# Patient Record
Sex: Female | Born: 1935 | Race: White | Hispanic: No | Smoking: Former smoker
Health system: Southern US, Community
[De-identification: ages and names within clinical notes are randomized; demographics above are authoritative.]

## PROBLEM LIST (undated history)

## (undated) DIAGNOSIS — I442 Atrioventricular block, complete: Secondary | ICD-10-CM

## (undated) DIAGNOSIS — Z972 Presence of dental prosthetic device (complete) (partial): Secondary | ICD-10-CM

## (undated) DIAGNOSIS — Z974 Presence of external hearing-aid: Secondary | ICD-10-CM

## (undated) DIAGNOSIS — M1990 Unspecified osteoarthritis, unspecified site: Secondary | ICD-10-CM

## (undated) DIAGNOSIS — I8290 Acute embolism and thrombosis of unspecified vein: Secondary | ICD-10-CM

## (undated) HISTORY — PX: COLONOSCOPY: SHX174

## (undated) HISTORY — PX: CHOLECYSTECTOMY: SHX55

---

## 2005-07-24 ENCOUNTER — Inpatient Hospital Stay: Payer: Self-pay | Admitting: Infectious Diseases

## 2005-07-24 ENCOUNTER — Other Ambulatory Visit: Payer: Self-pay

## 2005-08-03 ENCOUNTER — Emergency Department: Payer: Self-pay | Admitting: Internal Medicine

## 2005-08-11 ENCOUNTER — Ambulatory Visit: Payer: Self-pay | Admitting: Internal Medicine

## 2005-09-18 ENCOUNTER — Ambulatory Visit: Payer: Self-pay

## 2006-01-19 ENCOUNTER — Ambulatory Visit: Payer: Self-pay | Admitting: Unknown Physician Specialty

## 2006-08-17 ENCOUNTER — Other Ambulatory Visit: Payer: Self-pay

## 2006-08-17 ENCOUNTER — Inpatient Hospital Stay: Payer: Self-pay | Admitting: Internal Medicine

## 2010-03-03 ENCOUNTER — Ambulatory Visit: Payer: Self-pay | Admitting: Internal Medicine

## 2015-10-25 ENCOUNTER — Encounter: Payer: Self-pay | Admitting: *Deleted

## 2015-10-29 NOTE — Discharge Instructions (Signed)
INSTRUCTIONS FOLLOWING OCULOPLASTIC SURGERY °AMY M. FOWLER, MD ° °AFTER YOUR EYE SURGERY, THER ARE MANY THINGS THWIHC YOU, THE PATIENT, CAN DO TO ASSURE THE BEST POSSIBLE RESULT FROM YOUR OPERATION.  THIS SHEET SHOULD BE REFERRED TO WHENEVER QUESTIONS ARISE.  IF THERE ARE ANY QUESTIONS NOT ANSWERED HERE, DO NOT HESITATE TO CALL OUR OFFICE AT [phone] OR [phone].  THERE IS ALWAYS [facility] AVAILABLE TO CALL IF QUESTIONS OR PROBLEMS ARISE. ° °VISION: Your vision may be blurred and out of focus after surgery until you are able to stop using your ointment, swelling resolves and your eye(s) heal. This may take 1 to 2 weeks at the least.  If your vision becomes gradually more dim or dark, this is not normal and you need to call our office immediately. ° °EYE CARE: For the first 48 hours after surgery, use ice packs frequently - “20 minutes on, 20 minutes off” - to help reduce swelling and bruising.  Small bags of frozen peas or corn make good ice packs along with cloths soaked in ice water.  If you are wearing a patch or other type of dressing following surgery, keep this on for the amount of time specified by your doctor.  For the first week following surgery, you will need to treat your stitches with great care.  If is OK to shower, but take care to not allow soapy water to run into your eye(s) to help reduce changes of infection.  You may gently clean the eyelashes and around the eye(s) with cotton balls and sterile water, BUT DO NOT RUB THE STITCHES VIGOROUSLY.  Keeping your stitches moist with ointment will help promote healing with minimal scar formation. ° °ACTIVITY: When you leave the surgery center, you should go home, rest and be inactive.  The eye(s) may feel scratchy and keeping the eyes closed will allow for faster healing.  The first week following surgery, avoid straining (anything making the face turn red) or lifting over 20 pounds.  Additionally, avoid bending which causes your head to go below  your waist.  Using your eyes will NOT harm them, so feel free to read, watch television, use the computer, etc as desired.  Driving depends on each individual, so check with your doctor if you have questions about driving. ° °MEDICATIONS:  You will be given a prescription for an ointment to use 4 times a day on your stitches.  You can use the ointment in your eyes if they feel scratchy or irritated.  If you eyelid(s) don’t close completely when you sleep, put some ointment in your eyes before bedtime. ° °EMERGENCY: If you experience SEVERE EYE PAIN OR HEADACHE UNRELIEVED BY TYLENOL OR PERCOCET, NAUSEA OR VOMITING, WORSENING REDNESS, OR WORSENING VISION (ESPECIALLY VISION THAT WA INITIALLY BETTER) CALL [phone] OR [phone] DURING BUSINESS HOURS OR AFTER HOURS. ° °General Anesthesia, Adult, Care After °Refer to this sheet in the next few weeks. These instructions provide you with information on caring for yourself after your procedure. Your health care provider may also give you more specific instructions. Your treatment has been planned according to current medical practices, but problems sometimes occur. Call your health care provider if you have any problems or questions after your procedure. °WHAT TO EXPECT AFTER THE PROCEDURE °After the procedure, it is typical to experience: °· Sleepiness. °· Nausea and vomiting. °HOME CARE INSTRUCTIONS °· For the first 24 hours after general anesthesia: °¨ Have a responsible person with you. °¨ Do not drive a car. If you   are alone, do not take public transportation. °¨ Do not drink alcohol. °¨ Do not take medicine that has not been prescribed by your health care provider. °¨ Do not sign important papers or make important decisions. °¨ You may resume a normal diet and activities as directed by your health care provider. °· Change bandages (dressings) as directed. °· If you have questions or problems that seem related to general anesthesia, call the hospital and ask for  the anesthetist or anesthesiologist on call. °SEEK MEDICAL CARE IF: °· You have nausea and vomiting that continue the day after anesthesia. °· You develop a rash. °SEEK IMMEDIATE MEDICAL CARE IF:  °· You have difficulty breathing. °· You have chest pain. °· You have any allergic problems. °  °This information is not intended to replace advice given to you by your health care provider. Make sure you discuss any questions you have with your health care provider. °  °Document Released: 03/09/2001 Document Revised: 12/22/2014 Document Reviewed: 03/31/2012 °Elsevier Interactive Patient Education ©2016 [employer] ° °

## 2015-10-30 ENCOUNTER — Encounter: Payer: Self-pay | Admitting: *Deleted

## 2015-10-30 ENCOUNTER — Ambulatory Visit: Payer: Medicare Other | Admitting: Student in an Organized Health Care Education/Training Program

## 2015-10-30 ENCOUNTER — Encounter
Admission: RE | Disposition: A | Discharge status: Home / Self Care | Payer: Self-pay | Source: Ambulatory Visit | Attending: Ophthalmology

## 2015-10-30 ENCOUNTER — Ambulatory Visit
Admission: RE | Admit: 2015-10-30 | Discharge: 2015-10-30 | Disposition: A | Discharge status: Home / Self Care | Payer: Medicare Other | Source: Ambulatory Visit | Attending: Ophthalmology | Admitting: Ophthalmology

## 2015-10-30 DIAGNOSIS — H02403 Unspecified ptosis of bilateral eyelids: Secondary | ICD-10-CM | POA: Diagnosis not present

## 2015-10-30 DIAGNOSIS — Z79899 Other long term (current) drug therapy: Secondary | ICD-10-CM | POA: Insufficient documentation

## 2015-10-30 DIAGNOSIS — H02035 Senile entropion of left lower eyelid: Secondary | ICD-10-CM | POA: Diagnosis not present

## 2015-10-30 DIAGNOSIS — Z87891 Personal history of nicotine dependence: Secondary | ICD-10-CM | POA: Insufficient documentation

## 2015-10-30 DIAGNOSIS — Z86718 Personal history of other venous thrombosis and embolism: Secondary | ICD-10-CM | POA: Insufficient documentation

## 2015-10-30 DIAGNOSIS — Z9049 Acquired absence of other specified parts of digestive tract: Secondary | ICD-10-CM | POA: Insufficient documentation

## 2015-10-30 DIAGNOSIS — H02834 Dermatochalasis of left upper eyelid: Secondary | ICD-10-CM | POA: Insufficient documentation

## 2015-10-30 DIAGNOSIS — H9193 Unspecified hearing loss, bilateral: Secondary | ICD-10-CM | POA: Insufficient documentation

## 2015-10-30 DIAGNOSIS — M1990 Unspecified osteoarthritis, unspecified site: Secondary | ICD-10-CM | POA: Diagnosis not present

## 2015-10-30 DIAGNOSIS — H02831 Dermatochalasis of right upper eyelid: Secondary | ICD-10-CM | POA: Insufficient documentation

## 2015-10-30 DIAGNOSIS — H02045 Spastic entropion of left lower eyelid: Secondary | ICD-10-CM | POA: Diagnosis not present

## 2015-10-30 DIAGNOSIS — H02836 Dermatochalasis of left eye, unspecified eyelid: Secondary | ICD-10-CM | POA: Diagnosis present

## 2015-10-30 HISTORY — DX: Presence of dental prosthetic device (complete) (partial): Z97.2

## 2015-10-30 HISTORY — PX: PTOSIS REPAIR: SHX6568

## 2015-10-30 HISTORY — PX: ENTROPIAN REPAIR: SHX1512

## 2015-10-30 HISTORY — DX: Unspecified osteoarthritis, unspecified site: M19.90

## 2015-10-30 HISTORY — DX: Presence of external hearing-aid: Z97.4

## 2015-10-30 HISTORY — PX: BROW LIFT: SHX178

## 2015-10-30 HISTORY — DX: Acute embolism and thrombosis of unspecified vein: I82.90

## 2015-10-30 SURGERY — BLEPHAROPLASTY
Anesthesia: Monitor Anesthesia Care | Site: Eye | Laterality: Bilateral | Wound class: Clean

## 2015-10-30 MED ORDER — LIDOCAINE-EPINEPHRINE 2 %-1:100000 IJ SOLN
INTRAMUSCULAR | Status: DC | PRN
  Administered 2015-10-30: 10 mL via OPHTHALMIC

## 2015-10-30 MED ORDER — BSS IO SOLN
INTRAOCULAR | Status: DC | PRN
  Administered 2015-10-30: 15 mL

## 2015-10-30 MED ORDER — ERYTHROMYCIN 5 MG/GM OP OINT
TOPICAL_OINTMENT | OPHTHALMIC | Status: DC | PRN
  Administered 2015-10-30: 1

## 2015-10-30 MED ORDER — OXYCODONE-ACETAMINOPHEN 5-325 MG PO TABS: 1.0000 | ORAL_TABLET | ORAL | Status: DC | PRN

## 2015-10-30 MED ORDER — ALFENTANIL 500 MCG/ML IJ INJ
INJECTION | INTRAMUSCULAR | Status: DC | PRN
  Administered 2015-10-30: 500 ug via INTRAVENOUS

## 2015-10-30 MED ORDER — LACTATED RINGERS IV SOLN
INTRAVENOUS | Status: DC
  Administered 2015-10-30: 10:00:00 via INTRAVENOUS

## 2015-10-30 MED ORDER — BACITRACIN 500 UNIT/GM OP OINT: TOPICAL_OINTMENT | OPHTHALMIC | Status: DC

## 2015-10-30 MED ORDER — TETRACAINE HCL 0.5 % OP SOLN
OPHTHALMIC | Status: DC | PRN
  Administered 2015-10-30: 2 [drp] via OPHTHALMIC

## 2015-10-30 SURGICAL SUPPLY — 37 items
APPLICATOR COTTON TIP WD 3 STR (MISCELLANEOUS) ×6 IMPLANT
BLADE SURG 15 STRL LF DISP TIS (BLADE) ×2 IMPLANT
BLADE SURG 15 STRL SS (BLADE) ×4
CORD BIP STRL DISP 12FT (MISCELLANEOUS) ×3 IMPLANT
DRAPE HEAD BAR (DRAPES) ×3 IMPLANT
GAUZE SPONGE 4X4 12PLY STRL (GAUZE/BANDAGES/DRESSINGS) ×3 IMPLANT
GAUZE SPONGE NON-WVN 2X2 STRL (MISCELLANEOUS) ×10 IMPLANT
GLOVE SURG LX 7.0 MICRO (GLOVE) ×4
GLOVE SURG LX STRL 7.0 MICRO (GLOVE) ×2 IMPLANT
KIT ROOM TURNOVER OR (KITS) ×3 IMPLANT
MARKER SKIN XFINE TIP W/RULER (MISCELLANEOUS) ×3 IMPLANT
NEEDLE FILTER BLUNT 18X 1/2SAF (NEEDLE) ×2
NEEDLE FILTER BLUNT 18X1 1/2 (NEEDLE) ×1 IMPLANT
NEEDLE HYPO 30X.5 LL (NEEDLE) ×6 IMPLANT
PACK DRAPE NASAL/ENT (PACKS) ×3 IMPLANT
SOL PREP PVP 2OZ (MISCELLANEOUS) ×3
SOLUTION PREP PVP 2OZ (MISCELLANEOUS) ×1 IMPLANT
SPONGE VERSALON 2X2 STRL (MISCELLANEOUS) ×20
SUT CHROMIC 4-0 (SUTURE) ×4
SUT CHROMIC 4-0 M2 12X2 ARM (SUTURE) ×2
SUT CHROMIC 5 0 P 3 (SUTURE) ×6 IMPLANT
SUT ETHILON 4 0 CL P 3 (SUTURE) IMPLANT
SUT MERSILENE 4-0 S-2 (SUTURE) ×3 IMPLANT
SUT PDS AB 4-0 P3 18 (SUTURE) IMPLANT
SUT PLAIN GUT (SUTURE) ×6 IMPLANT
SUT PROLENE 5 0 P 3 (SUTURE) ×6 IMPLANT
SUT PROLENE 6 0 P 1 18 (SUTURE) IMPLANT
SUT SILK 4 0 G 3 (SUTURE) IMPLANT
SUT VIC AB 5-0 P-3 18X BRD (SUTURE) IMPLANT
SUT VIC AB 5-0 P3 18 (SUTURE)
SUT VICRYL 6-0  S14 CTD (SUTURE)
SUT VICRYL 6-0 S14 CTD (SUTURE) IMPLANT
SUT VICRYL 7 0 TG140 8 (SUTURE) IMPLANT
SUTURE CHRMC 4-0 M2 12X2 ARM (SUTURE) ×2 IMPLANT
SYR 3ML LL SCALE MARK (SYRINGE) ×3 IMPLANT
SYRINGE 10CC LL (SYRINGE) ×3 IMPLANT
WATER STERILE IRR 500ML POUR (IV SOLUTION) ×3 IMPLANT

## 2015-10-30 NOTE — Interval H&P Note (Signed)
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 After consideration of risks, benefits and other options for treatment, the patient has consented to  Procedure(s): BLEPHAROPLASTY (Bilateral) PTOSIS REPAIR (Bilateral) REPAIR OF ENTROPION SUTURES LEFT EYE  EXTENSIVE LEFT EYE (Bilateral) as a surgical intervention .  The patient's history has been reviewed, patient examined, no change in status, stable for surgery.  I have reviewed the patient's chart and labs.  Questions were answered to the patient's satisfaction.     Ether GriffinsFowler, Amy M

## 2015-10-30 NOTE — Op Note (Signed)
Preoperative Diagnosis:   1.  Visually significant bilateral brow ptosis.  2.  Visually significant dermatochalasis bilateral Upper Eyelid(s) 3.  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   Procedure(s) Performed:   1. Bilateral Direct brow lift to improve vision.  2.  Upper eyelid blepharoplasty with excess skin excision bilateral Upper Eyelid(s) 3.  Left lower eyelid lateral tarsal strip to correct senile entropion 4.  Left lower eyelid Quickert sutures to correct spastic entropion  Teaching Surgeon: Hubbard RobinsonAmy M. Ether GriffinsFowler, M.D.   Assistants: None   Anesthesia: MAC  Specimens: None.  Estimated Blood Loss: Minimal.  Complications: None.  Operative Findings: None Dictated  PROCEDURE:   Allergies were reviewed and the patient has No Known Allergies..   After the risks, benefits, complications and alternatives were discussed with the patient, appropriate informed consent was obtained. While seated in an upright position and looking in primary gaze, the amount of supra-brow skin to be removed was measured and marked in an elliptical pattern. The patient was then brought to the operating suite and reclined supine.  Timeout was conducted and the patient was sedated. Local anesthetic consisting of a 50-50 mixture of 2% lidocaine with epinephrine and 0.75% bupivacaine with added Hylenex was injected subcutaneously to the bilateral brow region(s) and down to the periosteum and  subcutaneously to both upper eyelid(s). Anesthetic was also injected sub-cutaneously and sub-conjunctivally to the left lower eyelid. After adequate local was instilled, the patient was prepped and draped in the usual sterile fashion for eyelid surgery.   Attention was turned to the right brow region. A #15 blade was used to create a bevelled incision along the premarked incision line. A skin and subcutaneous tissue flap was then excised and hemostasis was obtained with bipolar  cautery. The deep tissues were reapproximated with interrupted vertical 5-0 chromic sutures. The skin margin was reapproximated with a running locking 5-0 Prolene suture. Attention was then turned to the opposite brow region where the same procedure was performed in the same manner.    Attention was then turned to the upper eyelids. A 9mm upper eyelid crease incision line was marked with calipers on both upper eyelid(s).  A pinch test was used to estimate the amount of excess skin to remove and this was marked in standard blepharoplasty style fashion. Attention was turned to the right upper eyelid. A #15 blade was used to open the premarked incision line. A skin only flap was excised and hemostasis was obtained with bipolar cautery.   A buttonhole was created medially through orbicularis and orbital septum to reveal the medial fat pocket. This was transected free from fascial attachments, allowed to prolapse anteriorly, cauterized towards the pedicle base and excised  Attention was then turned to the opposite eyelid where the same procedure was performed in the same manner.   The skin incisions were closed with a combination of interrupted and  running 6-0 fast absorbing plain gut suture.   Attention was then turned to the left lateral canthal angle. Westcott scissors were used to create a lateral canthotomy. An inferior cantholysis was created followed by hemostasis with bipolar cautery. The anterior posterior lamella of the lid were divided for approximately 8 mm. A strip of epithelium was excised off the superior margin of the tarsal strip and conjunctiva and retractors were excised off the inferior margin of the tarsal strip. Attention was then turned to the central left lower lid. To double-armed 4-0 chromic sutures were passed full-thickness from conjunctival  side to skin side to even for the left lower lid margin. These were tied and trimmed. A double-armed 4-0 Mersilene suture was then passed each  arm to the terminal portion of the tarsal strip. Each arm was in a fixed to the periosteum of the inner portion of the lateral orbital rim at the level of Whitnall's tubercle. The sutures were advanced and tied off which created excellent elevation and tightening of the lower lid. A strip of skin with lash follicles was excised. The lateral canthal angle was reformed with an interrupted 6-0 fast absorbing plain gut suture. Orbicularis was reapproximated with interrupted horizontal subcuticular 6-0 fast absorbing plain gut sutures. Skin was closed with interrupted 6-0 fast absorbing plain gut suture.  The patient tolerated the procedure well. Erythromycin ophthalmic ointment was applied to the incision site(s) followed by ice packs.The patient was taken to the recovery area where she recovered without difficulty.  Post-Op Plan/Instructions:  The patient was instructed to use ice packs frequently for the next 48 hours .she was instructed to use bacitracin ophthalmic ointment on her incisions 4 times a day for the next 12 to 14 days. she was given a prescription for Percocet for pain control should Tylenol not be effective. she was asked to to follow up in 10 days time for suture removal or sooner as needed for problems.  Teaching Surgeon Attestation: None  Cynithia Hakimi M. Ether Griffins, M.D. Attending,Ophthalmology

## 2015-10-30 NOTE — Anesthesia Postprocedure Evaluation (Signed)
  Anesthesia Post-op Note  Patient: IT trainerBetty Abdo  Procedure(s) Performed: Procedure(s): BLEPHAROPLASTY (Bilateral) PTOSIS REPAIR (Bilateral) REPAIR OF ENTROPION SUTURES LEFT EYE  EXTENSIVE LEFT EYE (Bilateral)  Anesthesia type:MAC  Patient location: PACU  Post pain: Pain level controlled  Post assessment: Post-op Vital signs reviewed, Patient's Cardiovascular Status Stable, Respiratory Function Stable, Patent Airway and No signs of Nausea or vomiting  Post vital signs: Reviewed and stable  Last Vitals:  Filed Vitals:   10/30/15 1237  BP: 123/63  Pulse: 74  Temp: 36.1 C  Resp: 14    Level of consciousness: awake, alert  and patient cooperative  Complications: No apparent anesthesia complications

## 2015-10-30 NOTE — Anesthesia Preprocedure Evaluation (Signed)
Anesthesia Evaluation  Patient identified by MRN, date of birth, ID band  Reviewed: NPO status   History of Anesthesia Complications Negative for: history of anesthetic complications  Airway Mallampati: II  TM Distance: >3 FB Neck ROM: full    Dental no notable dental hx. (+) Lower Dentures, Upper Dentures   Pulmonary neg pulmonary ROS, former smoker,    Pulmonary exam normal        Cardiovascular Exercise Tolerance: Good + DVT (2011)  Normal cardiovascular exam     Neuro/Psych HOH  negative psych ROS   GI/Hepatic negative GI ROS, Neg liver ROS,   Endo/Other  negative endocrine ROS  Renal/GU negative Renal ROS  negative genitourinary   Musculoskeletal  (+) Arthritis ,   Abdominal   Peds  Hematology negative hematology ROS (+)   Anesthesia Other Findings   Reproductive/Obstetrics negative OB ROS                             Anesthesia Physical Anesthesia Plan  ASA: II  Anesthesia Plan: MAC   Post-op Pain Management:    Induction:   Airway Management Planned:   Additional Equipment:   Intra-op Plan:   Post-operative Plan:   Informed Consent: I have reviewed the patients History and Physical, chart, labs and discussed the procedure including the risks, benefits and alternatives for the proposed anesthesia with the patient or authorized representative who has indicated his/her understanding and acceptance.     Plan Discussed with: CRNA  Anesthesia Plan Comments:         Anesthesia Quick Evaluation

## 2015-10-30 NOTE — Anesthesia Procedure Notes (Signed)
Procedure Name: MAC Performed by: Jeannie Mallinger Pre-anesthesia Checklist: Patient identified, Emergency Drugs available, Suction available, Patient being monitored and Timeout performed Patient Re-evaluated:Patient Re-evaluated prior to inductionOxygen Delivery Method: Nasal cannula       

## 2015-10-30 NOTE — Transfer of Care (Deleted)
Immediate Anesthesia Transfer of Care Note  Patient: [name]  Procedure(s) Performed: Procedure(s): BLEPHAROPLASTY (Bilateral) PTOSIS REPAIR (Bilateral) REPAIR OF ENTROPION SUTURES LEFT EYE  EXTENSIVE LEFT EYE (Bilateral)  Patient Location: PACU  Anesthesia Type: MAC  Level of Consciousness: awake, alert  and patient cooperative  Airway and Oxygen Therapy: Patient Spontanous Breathing and Patient connected to supplemental oxygen  Post-op Assessment: Post-op Vital signs reviewed, Patient's Cardiovascular Status Stable, Respiratory Function Stable, Patent Airway and No signs of Nausea or vomiting  Post-op Vital Signs: Reviewed and stable  Complications: No apparent anesthesia complications

## 2015-10-30 NOTE — Transfer of Care (Signed)
Immediate Anesthesia Transfer of Care Note  Patient: Erin Browning  Procedure(s) Performed: Procedure(s): BLEPHAROPLASTY (Bilateral) PTOSIS REPAIR (Bilateral) REPAIR OF ENTROPION SUTURES LEFT EYE  EXTENSIVE LEFT EYE (Bilateral)  Patient Location: PACU  Anesthesia Type: MAC  Level of Consciousness: awake, alert  and patient cooperative  Airway and Oxygen Therapy: Patient Spontanous Breathing and Patient connected to supplemental oxygen  Post-op Assessment: Post-op Vital signs reviewed, Patient's Cardiovascular Status Stable, Respiratory Function Stable, Patent Airway and No signs of Nausea or vomiting  Post-op Vital Signs: Reviewed and stable  Complications: No apparent anesthesia complications  

## 2015-10-30 NOTE — H&P (Signed)
  See the completed history and physical note from [facility] completed on 10/24/2015

## 2015-10-31 ENCOUNTER — Encounter: Payer: Self-pay | Admitting: Ophthalmology

## 2015-10-31 NOTE — Addendum Note (Signed)
Addendum  created 10/31/15 0827 by Jimmy PicketMichael Addyson Traub, CRNA   Modules edited: Anesthesia Responsible Staff

## 2016-01-08 ENCOUNTER — Encounter: Payer: Self-pay | Admitting: *Deleted

## 2016-01-14 NOTE — Discharge Instructions (Signed)

## 2016-01-16 ENCOUNTER — Ambulatory Visit: Payer: Medicare Other | Admitting: Student in an Organized Health Care Education/Training Program

## 2016-01-16 ENCOUNTER — Ambulatory Visit
Admission: RE | Admit: 2016-01-16 | Discharge: 2016-01-16 | Disposition: A | Discharge status: Home / Self Care | Payer: Medicare Other | Source: Ambulatory Visit | Attending: Ophthalmology | Admitting: Ophthalmology

## 2016-01-16 ENCOUNTER — Encounter
Admission: RE | Disposition: A | Discharge status: Home / Self Care | Payer: Self-pay | Source: Ambulatory Visit | Attending: Ophthalmology

## 2016-01-16 DIAGNOSIS — H2512 Age-related nuclear cataract, left eye: Secondary | ICD-10-CM | POA: Insufficient documentation

## 2016-01-16 DIAGNOSIS — H9193 Unspecified hearing loss, bilateral: Secondary | ICD-10-CM | POA: Insufficient documentation

## 2016-01-16 DIAGNOSIS — Z79899 Other long term (current) drug therapy: Secondary | ICD-10-CM | POA: Insufficient documentation

## 2016-01-16 DIAGNOSIS — Z87891 Personal history of nicotine dependence: Secondary | ICD-10-CM | POA: Insufficient documentation

## 2016-01-16 DIAGNOSIS — M1991 Primary osteoarthritis, unspecified site: Secondary | ICD-10-CM | POA: Insufficient documentation

## 2016-01-16 DIAGNOSIS — Z86718 Personal history of other venous thrombosis and embolism: Secondary | ICD-10-CM | POA: Diagnosis not present

## 2016-01-16 DIAGNOSIS — Z9049 Acquired absence of other specified parts of digestive tract: Secondary | ICD-10-CM | POA: Diagnosis not present

## 2016-01-16 DIAGNOSIS — H269 Unspecified cataract: Secondary | ICD-10-CM | POA: Diagnosis present

## 2016-01-16 HISTORY — PX: CATARACT EXTRACTION W/PHACO: SHX586

## 2016-01-16 SURGERY — PHACOEMULSIFICATION, CATARACT, WITH IOL INSERTION
Anesthesia: Monitor Anesthesia Care | Laterality: Left | Wound class: Clean

## 2016-01-16 MED ORDER — BSS IO SOLN
INTRAOCULAR | Status: DC | PRN
  Administered 2016-01-16: 65 mL via OPHTHALMIC

## 2016-01-16 MED ORDER — ARMC OPHTHALMIC DILATING GEL
1.0000 "application " | OPHTHALMIC | Status: DC | PRN
  Administered 2016-01-16 (×2): 1 via OPHTHALMIC

## 2016-01-16 MED ORDER — POVIDONE-IODINE 5 % OP SOLN
1.0000 "application " | OPHTHALMIC | Status: DC | PRN
  Administered 2016-01-16: 1 via OPHTHALMIC

## 2016-01-16 MED ORDER — NA HYALUR & NA CHOND-NA HYALUR 0.4-0.35 ML IO KIT
PACK | INTRAOCULAR | Status: DC | PRN
  Administered 2016-01-16: 1 mL via INTRAOCULAR

## 2016-01-16 MED ORDER — TIMOLOL MALEATE 0.5 % OP SOLN
OPHTHALMIC | Status: DC | PRN
  Administered 2016-01-16: 1 [drp] via OPHTHALMIC

## 2016-01-16 MED ORDER — BRIMONIDINE TARTRATE 0.2 % OP SOLN
OPHTHALMIC | Status: DC | PRN
  Administered 2016-01-16: 1 [drp] via OPHTHALMIC

## 2016-01-16 MED ORDER — TETRACAINE HCL 0.5 % OP SOLN
1.0000 [drp] | OPHTHALMIC | Status: DC | PRN
  Administered 2016-01-16: 1 [drp] via OPHTHALMIC

## 2016-01-16 MED ORDER — CEFUROXIME OPHTHALMIC INJECTION 1 MG/0.1 ML
INJECTION | OPHTHALMIC | Status: DC | PRN
  Administered 2016-01-16: 0.1 mL via INTRACAMERAL

## 2016-01-16 MED ORDER — FENTANYL CITRATE (PF) 100 MCG/2ML IJ SOLN
INTRAMUSCULAR | Status: DC | PRN
  Administered 2016-01-16: 25 ug via INTRAVENOUS

## 2016-01-16 SURGICAL SUPPLY — 27 items
CANNULA ANT/CHMB 27GA (MISCELLANEOUS) ×3 IMPLANT
CARTRIDGE ABBOTT (MISCELLANEOUS) ×3 IMPLANT
GLOVE SURG LX 7.5 STRW (GLOVE) ×2
GLOVE SURG LX STRL 7.5 STRW (GLOVE) ×1 IMPLANT
GLOVE SURG TRIUMPH 8.0 PF LTX (GLOVE) ×3 IMPLANT
GOWN STRL REUS W/ TWL LRG LVL3 (GOWN DISPOSABLE) ×2 IMPLANT
GOWN STRL REUS W/TWL LRG LVL3 (GOWN DISPOSABLE) ×4
LENS IOL TECNIS 22.5 (Intraocular Lens) ×3 IMPLANT
LENS IOL TECNIS MONO 1P 22.5 (Intraocular Lens) ×1 IMPLANT
MARKER SKIN SURG W/RULER VIO (MISCELLANEOUS) ×3 IMPLANT
NDL RETROBULBAR .5 NSTRL (NEEDLE) IMPLANT
NEEDLE FILTER BLUNT 18X 1/2SAF (NEEDLE) ×2
NEEDLE FILTER BLUNT 18X1 1/2 (NEEDLE) ×1 IMPLANT
PACK CATARACT BRASINGTON (MISCELLANEOUS) ×3 IMPLANT
PACK EYE AFTER SURG (MISCELLANEOUS) ×3 IMPLANT
PACK OPTHALMIC (MISCELLANEOUS) ×3 IMPLANT
RING MALYGIN 7.0 (MISCELLANEOUS) IMPLANT
SUT ETHILON 10-0 CS-B-6CS-B-6 (SUTURE)
SUT VICRYL  9 0 (SUTURE)
SUT VICRYL 9 0 (SUTURE) IMPLANT
SUTURE EHLN 10-0 CS-B-6CS-B-6 (SUTURE) IMPLANT
SYR 3ML LL SCALE MARK (SYRINGE) ×3 IMPLANT
SYR 5ML LL (SYRINGE) IMPLANT
SYR TB 1ML LUER SLIP (SYRINGE) ×3 IMPLANT
WATER STERILE IRR 250ML POUR (IV SOLUTION) ×3 IMPLANT
WATER STERILE IRR 500ML POUR (IV SOLUTION) IMPLANT
WIPE NON LINTING 3.25X3.25 (MISCELLANEOUS) ×3 IMPLANT

## 2016-01-16 NOTE — Anesthesia Preprocedure Evaluation (Signed)
Anesthesia Evaluation  Patient identified by MRN, date of birth, ID band Patient awake    Reviewed: Allergy & Precautions, NPO status , Patient's Chart, lab work & pertinent test results, reviewed documented beta blocker date and time   Airway Mallampati: I  TM Distance: >3 FB Neck ROM: Full    Dental  (+) Upper Dentures, Lower Dentures   Pulmonary former smoker,    Pulmonary exam normal        Cardiovascular negative cardio ROS Normal cardiovascular exam     Neuro/Psych negative neurological ROS  negative psych ROS   GI/Hepatic negative GI ROS, Neg liver ROS,   Endo/Other  negative endocrine ROS  Renal/GU negative Renal ROS     Musculoskeletal  (+) Arthritis , Osteoarthritis,    Abdominal   Peds  Hematology   Anesthesia Other Findings   Reproductive/Obstetrics                             Anesthesia Physical Anesthesia Plan  ASA: II  Anesthesia Plan: MAC   Post-op Pain Management:    Induction: Intravenous  Airway Management Planned:   Additional Equipment:   Intra-op Plan:   Post-operative Plan:   Informed Consent: I have reviewed the patients History and Physical, chart, labs and discussed the procedure including the risks, benefits and alternatives for the proposed anesthesia with the patient or authorized representative who has indicated his/her understanding and acceptance.     Plan Discussed with: CRNA  Anesthesia Plan Comments:         Anesthesia Quick Evaluation

## 2016-01-16 NOTE — H&P (Signed)
  The History and Physical notes are on paper, have been signed, and are to be scanned. The patient remains stable and unchanged from the H&P.   Previous H&P reviewed, patient examined, and there are no changes.  Erin Browning 01/16/2016 8:16 AM

## 2016-01-16 NOTE — Anesthesia Procedure Notes (Signed)
Procedure Name: MAC Performed by: Ashely Joshua Pre-anesthesia Checklist: Patient identified, Emergency Drugs available, Suction available, Timeout performed and Patient being monitored Patient Re-evaluated:Patient Re-evaluated prior to inductionOxygen Delivery Method: Nasal cannula Placement Confirmation: positive ETCO2     

## 2016-01-16 NOTE — Anesthesia Postprocedure Evaluation (Signed)
Anesthesia Post Note  Patient: [name]  Procedure(s) Performed: Procedure(s) (LRB): CATARACT EXTRACTION PHACO AND INTRAOCULAR LENS PLACEMENT (IOC) (Left)  Patient location during evaluation: PACU Anesthesia Type: MAC Level of consciousness: awake and alert Pain management: pain level controlled Vital Signs Assessment: post-procedure vital signs reviewed and stable Respiratory status: nonlabored ventilation Cardiovascular status: stable Postop Assessment: no signs of nausea or vomiting and adequate PO intake Anesthetic complications: no    [name]

## 2016-01-16 NOTE — Transfer of Care (Signed)
Immediate Anesthesia Transfer of Care Note  Patient: [name]  Procedure(s) Performed: Procedure(s): CATARACT EXTRACTION PHACO AND INTRAOCULAR LENS PLACEMENT (IOC) (Left)  Patient Location: PACU  Anesthesia Type: MAC  Level of Consciousness: awake, alert  and patient cooperative  Airway and Oxygen Therapy: Patient Spontanous Breathing and Patient connected to supplemental oxygen  Post-op Assessment: Post-op Vital signs reviewed, Patient's Cardiovascular Status Stable, Respiratory Function Stable, Patent Airway and No signs of Nausea or vomiting  Post-op Vital Signs: Reviewed and stable  Complications: No apparent anesthesia complications

## 2016-01-16 NOTE — Op Note (Signed)
OPERATIVE NOTE  Erin Browning [id number] 01/16/2016   PREOPERATIVE DIAGNOSIS:  Nuclear sclerotic cataract left eye. H25.12   POSTOPERATIVE DIAGNOSIS:    Nuclear sclerotic cataract left eye.     PROCEDURE:  Phacoemusification with posterior chamber intraocular lens placement of the left eye   LENS:   Implant Name Type Inv. Item Serial No. Manufacturer Lot No. LRB No. Used  LENS IMPL INTRAOC [id number] 22.5 - [id number] Intraocular Lens LENS IMPL INTRAOC [id number] 22.5 [id number] AMO   Left 1        ULTRASOUND TIME: 11  % of 0 minutes 57 seconds, CDE 6.3  SURGEON:  Deirdre Evener, MD   ANESTHESIA:  Topical with tetracaine drops and 2% Xylocaine jelly.   COMPLICATIONS:  Posterior capsule tear with sulcus placement of IOL optic   DESCRIPTION OF PROCEDURE:  The patient was identified in the holding room and transported to the operating room and placed in the supine position under the operating microscope.  The left eye was identified as the operative eye and it was prepped and draped in the usual sterile ophthalmic fashion.   A 1 millimeter clear-corneal paracentesis was made at the 1:30 position.  The anterior chamber was filled with Viscoat viscoelastic.  A 2.4 millimeter keratome was used to make a near-clear corneal incision at the 10:30 position.  .  A curvilinear capsulorrhexis was made with a cystotome and capsulorrhexis forceps.  Balanced salt solution was used to hydrodissect and hydrodelineate the nucleus.   Phacoemulsification was then used in stop and chop fashion to remove the lens nucleus and epinucleus.  The remaining cortex was then removed using the irrigation and aspiration handpiece. Provisc was then placed into the capsular bag to distend it for lens placement.  A lens was then injected into the capsular bag.  The remaining viscoelastic was aspirated.  The lens was noted to be decentered.  BSS was used to recenter the lens. At that point, the inferior IOL margin was noted  to descend.  A posterior capsule tear was recognized.  Additional viscoelastic was placed under the IOL.  A Sinskey hook was used to elevate the IOL and perform reverse optic capture, with the optic in the sulcus and the haptics in the bag.  The lens was noted to have a stable position.  Dip and snip vitrectomy was performed to ensure the incisions were clear of vitreous strands.   Wounds were hydrated with balanced salt solution.  The anterior chamber was inflated to a physiologic pressure with balanced salt solution.  No wound leaks were noted. Cefuroxime 0.1 ml of a /ml solution was injected into the anterior chamber for a dose of 1 mg of intracameral antibiotic at the completion of the case.   Timolol and Brimonidine drops were applied to the eye.  The patient was taken to the recovery room in stable condition without complications of anesthesia or surgery.  [name] 01/16/2016, 9:16 AM

## 2016-01-17 ENCOUNTER — Encounter: Payer: Self-pay | Admitting: Ophthalmology

## 2016-02-05 ENCOUNTER — Encounter: Payer: Self-pay | Admitting: *Deleted

## 2016-02-15 NOTE — Discharge Instructions (Signed)

## 2016-02-18 ENCOUNTER — Ambulatory Visit: Payer: Medicare Other | Admitting: Student in an Organized Health Care Education/Training Program

## 2016-02-18 ENCOUNTER — Ambulatory Visit
Admission: RE | Admit: 2016-02-18 | Discharge: 2016-02-18 | Disposition: A | Discharge status: Home / Self Care | Payer: Medicare Other | Source: Ambulatory Visit | Attending: Ophthalmology | Admitting: Ophthalmology

## 2016-02-18 ENCOUNTER — Encounter
Admission: RE | Disposition: A | Discharge status: Home / Self Care | Payer: Self-pay | Source: Ambulatory Visit | Attending: Ophthalmology

## 2016-02-18 DIAGNOSIS — M1990 Unspecified osteoarthritis, unspecified site: Secondary | ICD-10-CM | POA: Insufficient documentation

## 2016-02-18 DIAGNOSIS — Z9049 Acquired absence of other specified parts of digestive tract: Secondary | ICD-10-CM | POA: Diagnosis not present

## 2016-02-18 DIAGNOSIS — Z86718 Personal history of other venous thrombosis and embolism: Secondary | ICD-10-CM | POA: Insufficient documentation

## 2016-02-18 DIAGNOSIS — H269 Unspecified cataract: Secondary | ICD-10-CM | POA: Diagnosis present

## 2016-02-18 DIAGNOSIS — Z9849 Cataract extraction status, unspecified eye: Secondary | ICD-10-CM | POA: Insufficient documentation

## 2016-02-18 DIAGNOSIS — H2511 Age-related nuclear cataract, right eye: Secondary | ICD-10-CM | POA: Diagnosis not present

## 2016-02-18 DIAGNOSIS — Z79899 Other long term (current) drug therapy: Secondary | ICD-10-CM | POA: Diagnosis not present

## 2016-02-18 DIAGNOSIS — Z9889 Other specified postprocedural states: Secondary | ICD-10-CM | POA: Insufficient documentation

## 2016-02-18 DIAGNOSIS — Z87891 Personal history of nicotine dependence: Secondary | ICD-10-CM | POA: Diagnosis not present

## 2016-02-18 HISTORY — PX: CATARACT EXTRACTION W/PHACO: SHX586

## 2016-02-18 SURGERY — PHACOEMULSIFICATION, CATARACT, WITH IOL INSERTION
Anesthesia: Monitor Anesthesia Care | Laterality: Right | Wound class: Clean

## 2016-02-18 MED ORDER — POVIDONE-IODINE 5 % OP SOLN
1.0000 "application " | OPHTHALMIC | Status: DC | PRN
  Administered 2016-02-18: 1 via OPHTHALMIC

## 2016-02-18 MED ORDER — TIMOLOL MALEATE 0.5 % OP SOLN
OPHTHALMIC | Status: DC | PRN
  Administered 2016-02-18: 1 [drp] via OPHTHALMIC

## 2016-02-18 MED ORDER — NA HYALUR & NA CHOND-NA HYALUR 0.4-0.35 ML IO KIT
PACK | INTRAOCULAR | Status: DC | PRN
  Administered 2016-02-18: 1 mL via INTRAOCULAR

## 2016-02-18 MED ORDER — LIDOCAINE HCL (PF) 4 % IJ SOLN
INTRAOCULAR | Status: DC | PRN
  Administered 2016-02-18: 4 mL via OPHTHALMIC

## 2016-02-18 MED ORDER — FENTANYL CITRATE (PF) 100 MCG/2ML IJ SOLN
INTRAMUSCULAR | Status: DC | PRN
  Administered 2016-02-18: 25 ug via INTRAVENOUS
  Administered 2016-02-18: 20 ug via INTRAVENOUS

## 2016-02-18 MED ORDER — ARMC OPHTHALMIC DILATING GEL
1.0000 | OPHTHALMIC | Status: DC | PRN
Start: 2016-02-18 — End: 2016-02-18
  Administered 2016-02-18 (×2): 1 via OPHTHALMIC

## 2016-02-18 MED ORDER — TETRACAINE HCL 0.5 % OP SOLN
1.0000 [drp] | OPHTHALMIC | Status: DC | PRN
  Administered 2016-02-18: 1 [drp] via OPHTHALMIC

## 2016-02-18 MED ORDER — LACTATED RINGERS IV SOLN: INTRAVENOUS | Status: DC

## 2016-02-18 MED ORDER — EPINEPHRINE HCL 1 MG/ML IJ SOLN
INTRAOCULAR | Status: DC | PRN
  Administered 2016-02-18: 82 mL via OPHTHALMIC

## 2016-02-18 MED ORDER — CEFUROXIME OPHTHALMIC INJECTION 1 MG/0.1 ML
INJECTION | OPHTHALMIC | Status: DC | PRN
  Administered 2016-02-18: 0.1 mL via INTRACAMERAL

## 2016-02-18 MED ORDER — BRIMONIDINE TARTRATE 0.2 % OP SOLN
OPHTHALMIC | Status: DC | PRN
  Administered 2016-02-18: 1 [drp] via OPHTHALMIC

## 2016-02-18 SURGICAL SUPPLY — 27 items
CANNULA ANT/CHMB 27GA (MISCELLANEOUS) ×3 IMPLANT
CARTRIDGE ABBOTT (MISCELLANEOUS) ×3 IMPLANT
GLOVE SURG LX 7.5 STRW (GLOVE) ×2
GLOVE SURG LX STRL 7.5 STRW (GLOVE) ×1 IMPLANT
GLOVE SURG TRIUMPH 8.0 PF LTX (GLOVE) ×3 IMPLANT
GOWN STRL REUS W/ TWL LRG LVL3 (GOWN DISPOSABLE) ×2 IMPLANT
GOWN STRL REUS W/TWL LRG LVL3 (GOWN DISPOSABLE) ×4
LENS IOL TECNIS 23.0 (Intraocular Lens) ×3 IMPLANT
LENS IOL TECNIS MONO 1P 23.0 (Intraocular Lens) ×1 IMPLANT
MARKER SKIN DUAL TIP RULER LAB (MISCELLANEOUS) ×3 IMPLANT
NDL RETROBULBAR .5 NSTRL (NEEDLE) IMPLANT
NEEDLE FILTER BLUNT 18X 1/2SAF (NEEDLE) ×2
NEEDLE FILTER BLUNT 18X1 1/2 (NEEDLE) ×1 IMPLANT
PACK CATARACT BRASINGTON (MISCELLANEOUS) ×3 IMPLANT
PACK EYE AFTER SURG (MISCELLANEOUS) ×3 IMPLANT
PACK OPTHALMIC (MISCELLANEOUS) ×3 IMPLANT
RING MALYGIN 7.0 (MISCELLANEOUS) IMPLANT
SUT ETHILON 10-0 CS-B-6CS-B-6 (SUTURE)
SUT VICRYL  9 0 (SUTURE)
SUT VICRYL 9 0 (SUTURE) IMPLANT
SUTURE EHLN 10-0 CS-B-6CS-B-6 (SUTURE) IMPLANT
SYR 3ML LL SCALE MARK (SYRINGE) ×3 IMPLANT
SYR 5ML LL (SYRINGE) IMPLANT
SYR TB 1ML LUER SLIP (SYRINGE) ×3 IMPLANT
WATER STERILE IRR 250ML POUR (IV SOLUTION) ×3 IMPLANT
WATER STERILE IRR 500ML POUR (IV SOLUTION) IMPLANT
WIPE NON LINTING 3.25X3.25 (MISCELLANEOUS) ×3 IMPLANT

## 2016-02-18 NOTE — Anesthesia Postprocedure Evaluation (Signed)
Anesthesia Post Note  Patient: IT trainerBetty Huge  Procedure(s) Performed: Procedure(s) (LRB): CATARACT EXTRACTION PHACO AND INTRAOCULAR LENS PLACEMENT (IOC) (Right)  Patient location during evaluation: PACU Anesthesia Type: MAC Level of consciousness: awake and alert Pain management: pain level controlled Vital Signs Assessment: post-procedure vital signs reviewed and stable Respiratory status: spontaneous breathing, nonlabored ventilation and respiratory function stable Cardiovascular status: blood pressure returned to baseline and stable Postop Assessment: no signs of nausea or vomiting Anesthetic complications: no    Alta CorningBacon, Pinky Ravan S

## 2016-02-18 NOTE — H&P (Signed)
   The History and Physical notes are on paper, have been signed, and are to be scanned. The patient remains stable and unchanged from the H&P.   Previous H&P reviewed, patient examined, and there are no changes.  Erin Browning 02/18/2016 7:35 AM

## 2016-02-18 NOTE — Transfer of Care (Signed)
Immediate Anesthesia Transfer of Care Note  Patient: [name]  Procedure(s) Performed: Procedure(s): CATARACT EXTRACTION PHACO AND INTRAOCULAR LENS PLACEMENT (IOC) (Right)  Patient Location: PACU  Anesthesia Type: MAC  Level of Consciousness: awake, alert  and patient cooperative  Airway and Oxygen Therapy: Patient Spontanous Breathing and Patient connected to supplemental oxygen  Post-op Assessment: Post-op Vital signs reviewed, Patient's Cardiovascular Status Stable, Respiratory Function Stable, Patent Airway and No signs of Nausea or vomiting  Post-op Vital Signs: Reviewed and stable  Complications: No apparent anesthesia complications

## 2016-02-18 NOTE — Op Note (Signed)
LOCATION:  [facility]   PREOPERATIVE DIAGNOSIS:    Nuclear sclerotic cataract right eye. H25.11   POSTOPERATIVE DIAGNOSIS:  Nuclear sclerotic cataract right eye.     PROCEDURE:  Phacoemusification with posterior chamber intraocular lens placement of the right eye   LENS:   Implant Name Type Inv. Item Serial No. Manufacturer Lot No. LRB No. Used  LENS IOL TECNIS 23.0 - [id number] Intraocular Lens LENS IOL TECNIS 23.0 [id number]   Right 1        ULTRASOUND TIME: 10 % of 1 minutes, 26 seconds.  CDE 9.0   SURGEON:  Deirdre Evenerhadwick R. Shila Kruczek, MD   ANESTHESIA:  Topical with tetracaine drops and 2% Xylocaine jelly, augmented with 1% preservative-free intracameral lidocaine.    COMPLICATIONS:  None.   DESCRIPTION OF PROCEDURE:  The patient was identified in the holding room and transported to the operating room and placed in the supine position under the operating microscope.  The right eye was identified as the operative eye and it was prepped and draped in the usual sterile ophthalmic fashion.   A 1 millimeter clear-corneal paracentesis was made at the 12:00 position.  0.5 ml of preservative-free 1% lidocaine was injected into the anterior chamber.  The anterior chamber was filled with Viscoat viscoelastic.  A 2.4 millimeter keratome was used to make a near-clear corneal incision at the 9:00 position.  A curvilinear capsulorrhexis was made with a cystotome and capsulorrhexis forceps.  Balanced salt solution was used to hydrodissect and hydrodelineate the nucleus.   Phacoemulsification was then used in stop and chop fashion to remove the lens nucleus and epinucleus.  The remaining cortex was then removed using the irrigation and aspiration handpiece. Provisc was then placed into the capsular bag to distend it for lens placement.  A lens was then injected into the capsular bag.  The remaining viscoelastic was aspirated.   Wounds were hydrated with balanced salt solution.  The  anterior chamber was inflated to a physiologic pressure with balanced salt solution.  No wound leaks were noted. Cefuroxime 0.1 ml of a 10mg /ml solution was injected into the anterior chamber for a dose of 1 mg of intracameral antibiotic at the completion of the case.   Timolol and Brimonidine drops were applied to the eye.  The patient was taken to the recovery room in stable condition without complications of anesthesia or surgery.   Erin Browning 02/18/2016, 8:02 AM

## 2016-02-18 NOTE — Anesthesia Preprocedure Evaluation (Addendum)
Anesthesia Evaluation  Patient identified by MRN, date of birth, ID band Patient awake    Reviewed: Allergy & Precautions, H&P , NPO status , Patient's Chart, lab work & pertinent test results, reviewed documented beta blocker date and time   Airway Mallampati: II  TM Distance: >3 FB Neck ROM: full    Dental  (+) Upper Dentures, Lower Dentures   Pulmonary former smoker,    Pulmonary exam normal breath sounds clear to auscultation       Cardiovascular Exercise Tolerance: Good negative cardio ROS   Rhythm:regular Rate:Normal     Neuro/Psych negative neurological ROS  negative psych ROS   GI/Hepatic negative GI ROS, Neg liver ROS,   Endo/Other  negative endocrine ROS  Renal/GU negative Renal ROS  negative genitourinary   Musculoskeletal   Abdominal   Peds  Hematology negative hematology ROS (+)   Anesthesia Other Findings   Reproductive/Obstetrics negative OB ROS                            Anesthesia Physical Anesthesia Plan  ASA: II  Anesthesia Plan: MAC   Post-op Pain Management:    Induction: Intravenous  Airway Management Planned: Nasal Cannula  Additional Equipment:   Intra-op Plan:   Post-operative Plan:   Informed Consent: I have reviewed the patients History and Physical, chart, labs and discussed the procedure including the risks, benefits and alternatives for the proposed anesthesia with the patient or authorized representative who has indicated his/her understanding and acceptance.   Dental Advisory Given  Plan Discussed with: CRNA  Anesthesia Plan Comments:         Anesthesia Quick Evaluation

## 2016-02-18 NOTE — Anesthesia Procedure Notes (Signed)
Procedure Name: MAC Performed by: Janicia Monterrosa Pre-anesthesia Checklist: Patient identified, Emergency Drugs available, Suction available, Timeout performed and Patient being monitored Patient Re-evaluated:Patient Re-evaluated prior to inductionOxygen Delivery Method: Nasal cannula Placement Confirmation: positive ETCO2     

## 2016-02-19 ENCOUNTER — Encounter: Payer: Self-pay | Admitting: Ophthalmology

## 2018-01-12 DIAGNOSIS — G5701 Lesion of sciatic nerve, right lower limb: Secondary | ICD-10-CM | POA: Diagnosis not present

## 2018-03-04 DIAGNOSIS — E538 Deficiency of other specified B group vitamins: Secondary | ICD-10-CM | POA: Diagnosis not present

## 2018-03-04 DIAGNOSIS — R739 Hyperglycemia, unspecified: Secondary | ICD-10-CM | POA: Diagnosis not present

## 2018-03-11 DIAGNOSIS — E119 Type 2 diabetes mellitus without complications: Secondary | ICD-10-CM | POA: Diagnosis not present

## 2018-03-11 DIAGNOSIS — E039 Hypothyroidism, unspecified: Secondary | ICD-10-CM | POA: Diagnosis not present

## 2018-03-11 DIAGNOSIS — Z0000 Encounter for general adult medical examination without abnormal findings: Secondary | ICD-10-CM | POA: Diagnosis not present

## 2018-03-11 DIAGNOSIS — E538 Deficiency of other specified B group vitamins: Secondary | ICD-10-CM | POA: Diagnosis not present

## 2018-08-26 ENCOUNTER — Inpatient Hospital Stay
Admission: EM | Admit: 2018-08-26 | Discharge: 2018-08-27 | DRG: 309 | Disposition: A | Discharge status: Other Acute Inpatient Hospital | Payer: PPO | Attending: Internal Medicine | Admitting: Internal Medicine

## 2018-08-26 ENCOUNTER — Emergency Department: Payer: PPO

## 2018-08-26 DIAGNOSIS — I10 Essential (primary) hypertension: Secondary | ICD-10-CM | POA: Diagnosis not present

## 2018-08-26 DIAGNOSIS — R531 Weakness: Secondary | ICD-10-CM | POA: Diagnosis not present

## 2018-08-26 DIAGNOSIS — R402142 Coma scale, eyes open, spontaneous, at arrival to emergency department: Secondary | ICD-10-CM | POA: Diagnosis not present

## 2018-08-26 DIAGNOSIS — R001 Bradycardia, unspecified: Secondary | ICD-10-CM | POA: Diagnosis present

## 2018-08-26 DIAGNOSIS — N179 Acute kidney failure, unspecified: Secondary | ICD-10-CM | POA: Diagnosis present

## 2018-08-26 DIAGNOSIS — Z66 Do not resuscitate: Secondary | ICD-10-CM | POA: Diagnosis present

## 2018-08-26 DIAGNOSIS — Z79899 Other long term (current) drug therapy: Secondary | ICD-10-CM

## 2018-08-26 DIAGNOSIS — R9431 Abnormal electrocardiogram [ECG] [EKG]: Secondary | ICD-10-CM | POA: Diagnosis not present

## 2018-08-26 DIAGNOSIS — E119 Type 2 diabetes mellitus without complications: Secondary | ICD-10-CM | POA: Diagnosis not present

## 2018-08-26 DIAGNOSIS — K529 Noninfective gastroenteritis and colitis, unspecified: Secondary | ICD-10-CM | POA: Diagnosis not present

## 2018-08-26 DIAGNOSIS — Z7189 Other specified counseling: Secondary | ICD-10-CM | POA: Diagnosis not present

## 2018-08-26 DIAGNOSIS — R748 Abnormal levels of other serum enzymes: Secondary | ICD-10-CM | POA: Diagnosis not present

## 2018-08-26 DIAGNOSIS — Z86718 Personal history of other venous thrombosis and embolism: Secondary | ICD-10-CM | POA: Diagnosis not present

## 2018-08-26 DIAGNOSIS — I455 Other specified heart block: Secondary | ICD-10-CM | POA: Diagnosis not present

## 2018-08-26 DIAGNOSIS — I4510 Unspecified right bundle-branch block: Secondary | ICD-10-CM | POA: Diagnosis present

## 2018-08-26 DIAGNOSIS — R7989 Other specified abnormal findings of blood chemistry: Secondary | ICD-10-CM

## 2018-08-26 DIAGNOSIS — I440 Atrioventricular block, first degree: Secondary | ICD-10-CM | POA: Diagnosis not present

## 2018-08-26 DIAGNOSIS — R402362 Coma scale, best motor response, obeys commands, at arrival to emergency department: Secondary | ICD-10-CM | POA: Diagnosis present

## 2018-08-26 DIAGNOSIS — Z9889 Other specified postprocedural states: Secondary | ICD-10-CM | POA: Diagnosis not present

## 2018-08-26 DIAGNOSIS — J9811 Atelectasis: Secondary | ICD-10-CM | POA: Diagnosis not present

## 2018-08-26 DIAGNOSIS — E860 Dehydration: Secondary | ICD-10-CM | POA: Diagnosis not present

## 2018-08-26 DIAGNOSIS — M1990 Unspecified osteoarthritis, unspecified site: Secondary | ICD-10-CM | POA: Diagnosis not present

## 2018-08-26 DIAGNOSIS — I442 Atrioventricular block, complete: Secondary | ICD-10-CM | POA: Diagnosis not present

## 2018-08-26 DIAGNOSIS — R402252 Coma scale, best verbal response, oriented, at arrival to emergency department: Secondary | ICD-10-CM | POA: Diagnosis not present

## 2018-08-26 DIAGNOSIS — Z87891 Personal history of nicotine dependence: Secondary | ICD-10-CM | POA: Diagnosis not present

## 2018-08-26 DIAGNOSIS — Z9049 Acquired absence of other specified parts of digestive tract: Secondary | ICD-10-CM | POA: Diagnosis not present

## 2018-08-26 DIAGNOSIS — R918 Other nonspecific abnormal finding of lung field: Secondary | ICD-10-CM | POA: Diagnosis not present

## 2018-08-26 DIAGNOSIS — I248 Other forms of acute ischemic heart disease: Secondary | ICD-10-CM | POA: Diagnosis present

## 2018-08-26 DIAGNOSIS — Z9841 Cataract extraction status, right eye: Secondary | ICD-10-CM | POA: Diagnosis not present

## 2018-08-26 DIAGNOSIS — Z515 Encounter for palliative care: Secondary | ICD-10-CM | POA: Diagnosis not present

## 2018-08-26 DIAGNOSIS — R778 Other specified abnormalities of plasma proteins: Secondary | ICD-10-CM

## 2018-08-26 DIAGNOSIS — Z9842 Cataract extraction status, left eye: Secondary | ICD-10-CM | POA: Diagnosis not present

## 2018-08-26 LAB — BASIC METABOLIC PANEL
ANION GAP: 6 (ref 5–15)
BUN: 47 mg/dL — ABNORMAL HIGH (ref 8–23)
CO2: 27 mmol/L (ref 22–32)
Calcium: 9 mg/dL (ref 8.9–10.3)
Chloride: 110 mmol/L (ref 98–111)
Creatinine, Ser: 1.45 mg/dL — ABNORMAL HIGH (ref 0.44–1.00)
GFR calc Af Amer: 35 mL/min — ABNORMAL LOW (ref >60)
GFR, EST NON AFRICAN AMERICAN: 30 mL/min — AB (ref >60)
GLUCOSE: 129 mg/dL — AB (ref 70–99)
Potassium: 4.6 mmol/L (ref 3.5–5.1)
Sodium: 143 mmol/L (ref 135–145)

## 2018-08-26 LAB — CBC
HCT: 44.7 % (ref 35.0–47.0)
Hemoglobin: 14.7 g/dL (ref 12.0–16.0)
MCH: 27.6 pg (ref 26.0–34.0)
MCHC: 32.8 g/dL (ref 32.0–36.0)
MCV: 83.9 fL (ref 80.0–100.0)
PLATELETS: 123 10*3/uL — AB (ref 150–440)
RBC: 5.33 MIL/uL — AB (ref 3.80–5.20)
RDW: 14.4 % (ref 11.5–14.5)
WBC: 14.1 10*3/uL — ABNORMAL HIGH (ref 3.6–11.0)

## 2018-08-26 LAB — BRAIN NATRIURETIC PEPTIDE: B Natriuretic Peptide: 735 pg/mL — ABNORMAL HIGH (ref 0.0–100.0)

## 2018-08-26 LAB — TROPONIN I: Troponin I: 0.04 ng/mL (ref <0.03)

## 2018-08-26 MED ORDER — ATROPINE SULFATE 1 MG/10ML IJ SOSY
1.0000 mg | PREFILLED_SYRINGE | Freq: Once | INTRAMUSCULAR | Status: AC
  Administered 2018-08-26: 1 mg via INTRAVENOUS
  Filled 2018-08-26: qty 10

## 2018-08-26 MED ORDER — ASPIRIN 81 MG PO CHEW
324.0000 mg | CHEWABLE_TABLET | Freq: Once | ORAL | Status: AC
  Administered 2018-08-26: 324 mg via ORAL
  Filled 2018-08-26: qty 4

## 2018-08-26 MED ORDER — ATROPINE SULFATE 1 MG/10ML IJ SOSY
0.5000 mg | PREFILLED_SYRINGE | Freq: Once | INTRAMUSCULAR | Status: AC
  Administered 2018-08-26: 0.5 mg via INTRAVENOUS
  Filled 2018-08-26: qty 10

## 2018-08-26 NOTE — H&P (Addendum)
[facility] [place] at [facility]   PATIENT NAME: Erin Browning    MR#:  [id number]  DATE OF BIRTH:  09-Oct-1923  DATE OF ADMISSION:  08/26/2018  PRIMARY CARE PHYSICIAN: Danella Penton, MD   REQUESTING/REFERRING PHYSICIAN:   CHIEF COMPLAINT:   Chief Complaint  Patient presents with  . Bradycardia    HISTORY OF PRESENT ILLNESS: Bobette Leyh  is a 84 y.o. female with a known history of osteoarthritis and DVT many years ago. Patient was brought to emergency room for lightheadedness going on for the past 24 hours, gradually getting worse.  She also had an episode of nausea, vomiting and diarrhea earlier today, at home.  No fever, chills, no chest pain. Upon evaluation in the emergency room, patient was noted with bradycardia, heart rate in 20s and 2-1 heart block.  She received atropine in the emergency room, 2 doses with very minimal improvement. Patient does not take any medications except for Tylenol, as needed for osteoarthritis. Labs done in emergency room are notable for BNP at 735, troponin at 0.04 and WBC at 14.1.  Platelet count is 123.  Creatinine is 1.45 and BUN is 47. Chest x-ray shows basilar opacities, likely secondary to atelectasis. Patient is admitted for further evaluation and treatment.   PAST MEDICAL HISTORY:   Past Medical History:  Diagnosis Date  . Arthritis    ankles  . Blood clot in vein    left leg, several yrs ago  . Wears dentures    full upper and lower  . Wears hearing aid    bilateral    PAST SURGICAL HISTORY:  Past Surgical History:  Procedure Laterality Date  . BROW LIFT Bilateral 10/30/2015   Procedure: BLEPHAROPLASTY;  Surgeon: Imagene Riches, MD;  Location: [facility];  Service: Ophthalmology;  Laterality: Bilateral;  . CATARACT EXTRACTION W/PHACO Left 01/16/2016   Procedure: CATARACT EXTRACTION PHACO AND INTRAOCULAR LENS PLACEMENT (IOC);  Surgeon: Lockie Mola, MD;  Location: [facility];   Service: Ophthalmology;  Laterality: Left;  . CATARACT EXTRACTION W/PHACO Right 02/18/2016   Procedure: CATARACT EXTRACTION PHACO AND INTRAOCULAR LENS PLACEMENT (IOC);  Surgeon: Lockie Mola, MD;  Location: [facility];  Service: Ophthalmology;  Laterality: Right;  . CHOLECYSTECTOMY    . COLONOSCOPY    . ENTROPIAN REPAIR Bilateral 10/30/2015   Procedure: REPAIR OF ENTROPION SUTURES LEFT EYE  EXTENSIVE LEFT EYE;  Surgeon: Imagene Riches, MD;  Location: [facility];  Service: Ophthalmology;  Laterality: Bilateral;  . PTOSIS REPAIR Bilateral 10/30/2015   Procedure: PTOSIS REPAIR;  Surgeon: Imagene Riches, MD;  Location: [facility];  Service: Ophthalmology;  Laterality: Bilateral;    SOCIAL HISTORY:  Social History   Tobacco Use  . Smoking status: Former Games developer  . Tobacco comment: quit 50+ yrs ago  Substance Use Topics  . Alcohol use: No    FAMILY HISTORY: No family history on file.  DRUG ALLERGIES: No Known Allergies  REVIEW OF SYSTEMS:   CONSTITUTIONAL: No fever, fatigue or weakness.  EYES: No changes in vision.  EARS, NOSE, AND THROAT: No tinnitus or ear pain.  Positive for severe bilateral hearing deficit. RESPIRATORY: No cough, shortness of breath, wheezing or hemoptysis.  CARDIOVASCULAR: No chest pain, orthopnea, edema.  GASTROINTESTINAL: Positive for nausea, vomiting, diarrhea, no abdominal pain.  GENITOURINARY: No dysuria, hematuria.  ENDOCRINE: No polyuria, nocturia. HEMATOLOGY: No bleeding. SKIN: No rash or lesion. MUSCULOSKELETAL: No joint pain at this time.   NEUROLOGIC: No focal  weakness.  PSYCHIATRY: No anxiety or depression.   MEDICATIONS AT HOME:  Prior to Admission medications   Medication Sig Start Date End Date Taking? Authorizing Provider  triamterene-hydrochlorothiazide (MAXZIDE-25) 37.5-25 MG tablet Take 1 tablet by mouth daily. 09/10/17  Yes [provider]  vitamin B-12 (CYANOCOBALAMIN) 1000 MCG tablet Take 1,000 mcg  by mouth daily. AM   Yes [provider]      PHYSICAL EXAMINATION:   VITAL SIGNS: Blood pressure (!) 106/44, pulse (!) 37, temperature 97.6 F (36.4 C), temperature source Oral, resp. rate 17, height 5\' 5"  (1.651 m), weight 63 kg, SpO2 98 %.  GENERAL:  84 y.o.-year-old patient lying in the bed with no acute distress.  EYES: Pupils equal, round, reactive to light and accommodation. No scleral icterus. Extraocular muscles intact.  HEENT: Head atraumatic, normocephalic. Oropharynx and nasopharynx clear.  Severe bilateral hearing deficit noted. NECK:  Supple, no jugular venous distention. No thyroid enlargement, no tenderness.  LUNGS: Normal breath sounds bilaterally, no wheezing, rales,rhonchi or crepitation. No use of accessory muscles of respiration.  CARDIOVASCULAR: S1, S2 normal. No S3/S4.  ABDOMEN: Soft, nontender, nondistended. Bowel sounds present. No organomegaly or mass.  EXTREMITIES: No pedal edema, cyanosis, or clubbing.  NEUROLOGIC: Cranial nerves II through XII are intact. Muscle strength 5/5 in all extremities. Sensation intact.   PSYCHIATRIC: The patient is alert and oriented x 3.  SKIN: No obvious rash, lesion, or ulcer.   LABORATORY PANEL:   CBC Recent Labs  Lab 08/26/18 1911  WBC 14.1*  HGB 14.7  HCT 44.7  PLT 123*  MCV 83.9  MCH 27.6  MCHC 32.8  RDW 14.4   ------------------------------------------------------------------------------------------------------------------  Chemistries  Recent Labs  Lab 08/26/18 2007  NA 143  K 4.6  CL 110  CO2 27  GLUCOSE 129*  BUN 47*  CREATININE 1.45*  CALCIUM 9.0   ------------------------------------------------------------------------------------------------------------------ estimated creatinine clearance is 21.8 mL/min (A) (by C-G formula based on SCr of 1.45 mg/dL (H)). ------------------------------------------------------------------------------------------------------------------ No results for  input(s): TSH, T4TOTAL, T3FREE, THYROIDAB in the last 72 hours.  Invalid input(s): FREET3   Coagulation profile No results for input(s): INR, PROTIME in the last 168 hours. ------------------------------------------------------------------------------------------------------------------- No results for input(s): DDIMER in the last 72 hours. -------------------------------------------------------------------------------------------------------------------  Cardiac Enzymes Recent Labs  Lab 08/26/18 2007  TROPONINI 0.04*   ------------------------------------------------------------------------------------------------------------------ Invalid input(s): POCBNP  ---------------------------------------------------------------------------------------------------------------  Urinalysis No results found for: COLORURINE, APPEARANCEUR, LABSPEC, PHURINE, GLUCOSEU, HGBUR, BILIRUBINUR, KETONESUR, PROTEINUR, UROBILINOGEN, NITRITE, LEUKOCYTESUR   RADIOLOGY: Dg Chest Portable 1 View  Result Date: 08/26/2018 CLINICAL DATA:  Patient with low heart rate.  Nausea. EXAM: PORTABLE CHEST 1 VIEW COMPARISON:  Chest radiograph 08/17/2006 FINDINGS: Monitoring leads overlie the patient. Pacer pad overlies the patient. Cardiomegaly. Tortuosity of the thoracic aorta. Bibasilar heterogeneous opacities. Thoracic spine degenerative changes. IMPRESSION: Cardiomegaly. Basilar heterogeneous opacities favored to represent atelectasis. Infection not excluded. Electronically Signed   By: Annia Belt M.D.   On: 08/26/2018 19:45    EKG: Orders placed or performed during the hospital encounter of 08/26/18  . EKG 12-Lead  . EKG 12-Lead  . EKG 12-Lead  . EKG 12-Lead  . EKG 12-Lead  . EKG 12-Lead    IMPRESSION AND PLAN:  1.  Symptomatic bradycardia, 2-1 heart block. Will admit patient to stepdown unit and continue to monitor on telemetry.  Will use atropine as needed for heart rate lower than 35.  Will check 2D  echo for further evaluation of heart function.  Cardiology is consulted for possible pacemaker placement.  2.  Acute renal failure, likely prerenal, secondary to dehydration from vomiting and diarrhea.  We will start gentle IV hydration and monitor kidney function closely.  Avoid nephrotoxic medications. 3.  Acute gastroenteritis.  Continue supportive measures with IV fluids and antinausea medication.  Will check stool studies if patient continues to have diarrhea. 4.  Borderline elevated troponin level, likely secondary to demand ischemia.  No acute ischemic changes on the EKG. we will continue to monitor on telemetry and follow troponin levels, to rule out ACS.  Will check 2D echo.  All the records are reviewed and case discussed with ED and ICU providers. Management plans discussed with the patient, family and they are in agreement.  CODE STATUS: DNR    TOTAL TIME TAKING CARE OF THIS PATIENT: 50 minutes.    Cammy CopaAngela Irini Leet M.D on 08/26/2018 at 11:42 PM  Between 7am to 6pm - Pager - [phone]  After 6pm go to [email or web address] - password [email or web address] [facility]  [facility] [place] at [facility]  [phone]  CC: Primary care physician; Danella PentonMiller, Mark F, MD

## 2018-08-26 NOTE — ED Notes (Signed)
Attempted to call report, RN Gearldine BienenstockBrandy unavailable, will call back

## 2018-08-26 NOTE — ED Notes (Signed)
First RN note:  Patient's vitals at the front desk 98% room air, 28 HR, 97.5 temo, 146/44 BP.  Patient taken straight back and placed on zoll.  MD and RN notified.

## 2018-08-26 NOTE — ED Triage Notes (Addendum)
Pt BIB family from home for low HR - per family in the 20/30s. Pt had N/V/D last night but just co nausea throughout the day. Pt co fatigue but denies any pain. Pt alert & orented x4. ABCs intact. NAD.   HR upon arrival in the 30s.

## 2018-08-26 NOTE — ED Notes (Signed)
Inpt MD at bedside

## 2018-08-26 NOTE — Consult Note (Addendum)
Name: Erin Browning MRN: [id number] DOB: 15-Dec-1923    ADMISSION DATE:  08/26/2018 CONSULTATION DATE: 08/26/2018  REFERRING MD : Dr. Caryn BeeMaier   CHIEF COMPLAINT: Bradycardia   BRIEF PATIENT DESCRIPTION:  84 yo female admitted with symptomatic bradycardia   SIGNIFICANT EVENTS/STUDIES:  09/12-Pt admitted to stepdown unit   HISTORY OF PRESENT ILLNESS:   This is a 84 yo female with a PMH of DVT, Diet Controlled Diabetes Mellitus, and Arthritis.  [facility] with bradycardia hr 20-30's, lightheadedness, and N/V/D.  Upon arrival to the ER EKG  revealed 2:1 heart block, hr 35 bpm and no ST elevation.   She received atropine x2 doses, however hr remained in the 30's with systolic bp 105-120's.  She does not take beta blockers or antiarrhythmic medications.  Lab results revealed BNP 735, troponin 0.04, and wbc 14.1.  CXR revealed basilar opacities likely secondary to atelectasis.  She was subsequently admitted to the stepdown unit by hospitalist team for further workup and treatment.    PAST MEDICAL HISTORY :   has a past medical history of Arthritis, Blood clot in vein, Wears dentures, and Wears hearing aid.  has a past surgical history that includes Cholecystectomy; Colonoscopy; Brow lift (Bilateral, 10/30/2015); Ptosis repair (Bilateral, 10/30/2015); Entropian repair (Bilateral, 10/30/2015); Cataract extraction w/PHACO (Left, 01/16/2016); and Cataract extraction w/PHACO (Right, 02/18/2016). Prior to Admission medications   Medication Sig Start Date End Date Taking? Authorizing Provider  triamterene-hydrochlorothiazide (MAXZIDE-25) 37.5-25 MG tablet Take 1 tablet by mouth daily. 09/10/17  Yes [provider]  vitamin B-12 (CYANOCOBALAMIN) 1000 MCG tablet Take 1,000 mcg by mouth daily. AM   Yes [provider]   No Known Allergies  FAMILY HISTORY:  family history is not on file. SOCIAL HISTORY:  reports that she has quit smoking. She does not have any  smokeless tobacco history on file. She reports that she does not drink alcohol.  REVIEW OF SYSTEMS: Positives in BOLD  Constitutional: Negative for fever, chills, weight loss, malaise/fatigue and diaphoresis.  HENT: Negative for hearing loss, ear pain, nosebleeds, congestion, sore throat, neck pain, tinnitus and ear discharge.   Eyes: Negative for blurred vision, double vision, photophobia, pain, discharge and redness.  Respiratory: Negative for cough, hemoptysis, sputum production, shortness of breath, wheezing and stridor.   Cardiovascular: Negative for chest pain, palpitations, orthopnea, claudication, leg swelling and PND.  Gastrointestinal: heartburn, nausea, vomiting, abdominal pain, diarrhea, constipation, blood in stool and melena.  Genitourinary: Negative for dysuria, urgency, frequency, hematuria and flank pain.  Musculoskeletal: Negative for myalgias, back pain, joint pain and falls.  Skin: Negative for itching and rash.  Neurological: dizziness, tingling, tremors, sensory change, speech change, focal weakness, seizures, loss of consciousness, weakness and headaches.  Endo/Heme/Allergies: Negative for environmental allergies and polydipsia. Does not bruise/bleed easily.  SUBJECTIVE:  No current complaints at this time   VITAL SIGNS: Temp:  [97.6 F (36.4 C)] 97.6 F (36.4 C) (09/12 1858) Pulse Rate:  [31-40] 34 (09/12 2240) Resp:  [10-22] 17 (09/12 2240) BP: (107-155)/(45-96) 112/45 (09/12 2240) SpO2:  [95 %-100 %] 97 % (09/12 2240) Weight:  [63 kg] 63 kg (09/12 1900)  PHYSICAL EXAMINATION: General: well developed, well nourished elderly female, NAD  Neuro: disoriented to situation at times, PERRLA, follows commands, HOH  HEENT: supple, no JVD  Cardiovascular: bradycardic, no R/G  Lungs: clear throughout, even, non labored  Abdomen: +BS x4, obese, soft, non tender, non distended  Musculoskeletal: 1+ bilateral lower extremity edema  Skin: intact  no rashes or lesions    Recent Labs  Lab 08/26/18 2007  NA 143  K 4.6  CL 110  CO2 27  BUN 47*  CREATININE 1.45*  GLUCOSE 129*   Recent Labs  Lab 08/26/18 1911  HGB 14.7  HCT 44.7  WBC 14.1*  PLT 123*   Dg Chest Portable 1 View  Result Date: 08/26/2018 CLINICAL DATA:  Patient with low heart rate.  Nausea. EXAM: PORTABLE CHEST 1 VIEW COMPARISON:  Chest radiograph 08/17/2006 FINDINGS: Monitoring leads overlie the patient. Pacer pad overlies the patient. Cardiomegaly. Tortuosity of the thoracic aorta. Bibasilar heterogeneous opacities. Thoracic spine degenerative changes. IMPRESSION: Cardiomegaly. Basilar heterogeneous opacities favored to represent atelectasis. Infection not excluded. Electronically Signed   By: Annia Belt M.D.   On: 08/26/2018 19:45    ASSESSMENT / PLAN:  Symptomatic bradycardia (2:1 Heart Block) Elevated troponin secondary to demand ischemia vs. NSTEMI  Continuous telemetry monitoring  Trend troponin's  Cardiology consulted appreciate input-to assess need for permanent pacemaker placement   Prn atropine for hr <35 Maintain map >65  Echo pending   Atelectasis  Supplemental O2 for dyspnea and/or hypoxia  Trend wbc and monitor fever curve  Prn bronchodilator therapy Pulmonary hygiene   Acute renal failure  Trend BMP  Replace electrolytes as indicated  Monitor UOP   Nausea/Vomiting Prn zofran   VTE px: subq heparin   Sonda Rumble, AGNP  Pulmonary/Critical Care Pager [phone] (please enter 7 digits) PCCM Consult Pager [phone] (please enter 7 digits)  Pulmonary/critical care attending  I have personally seen and examined Erin Browning, reviewed revise and confirm nurse practitioner's note. In the penalty performed history, physical, reviewed all laboratory and imaging studies.  84 yo female with a PMH of DVT, Diet Controlled Diabetes Mellitus, and Arthritis.  She presented to [facility] ER on 09/12 with bradycardia hr 20-30's, lightheadedness, and N/V/D.  Upon  arrival to the ER EKG  revealed 2:1 heart block, hr 35 bpm and no ST elevation.   Patient also has had some confusion this morning. Pending cardiology consultation. If mental status does not improve will obtain CT scan of head  Tora Kindred, D.O.

## 2018-08-26 NOTE — ED Provider Notes (Signed)
[facility] Emergency Department Provider Note  ____________________________________________  Time seen: Approximately 7:08 PM  I have reviewed the triage vital signs and the nursing notes.   HISTORY  Chief Complaint Bradycardia    HPI Erin Browning is a 84 y.o. female, otherwise healthy, presenting for lightheadedness with standing.  The patient reports that since last night, she has felt lightheaded when she stands.  She lives independently.  She denies any chest pain, palpitations, syncope.  No recent cough or cold symptoms.  She did have 2 episodes of vomiting and one episode of loose stool last night.  The symptoms have resolved but she has not had much to eat or drink today.  Upon arrival to the emergency department, the patient has a heart rate in the mid 30s.   Past Medical History:  Diagnosis Date  . Arthritis    ankles  . Blood clot in vein    left leg, several yrs ago  . Wears dentures    full upper and lower  . Wears hearing aid    bilateral    There are no active problems to display for this patient.   Past Surgical History:  Procedure Laterality Date  . BROW LIFT Bilateral 10/30/2015   Procedure: BLEPHAROPLASTY;  Surgeon: Imagene Riches, MD;  Location: [facility];  Service: Ophthalmology;  Laterality: Bilateral;  . CATARACT EXTRACTION W/PHACO Left 01/16/2016   Procedure: CATARACT EXTRACTION PHACO AND INTRAOCULAR LENS PLACEMENT (IOC);  Surgeon: Lockie Mola, MD;  Location: [facility];  Service: Ophthalmology;  Laterality: Left;  . CATARACT EXTRACTION W/PHACO Right 02/18/2016   Procedure: CATARACT EXTRACTION PHACO AND INTRAOCULAR LENS PLACEMENT (IOC);  Surgeon: Lockie Mola, MD;  Location: [facility];  Service: Ophthalmology;  Laterality: Right;  . CHOLECYSTECTOMY    . COLONOSCOPY    . ENTROPIAN REPAIR Bilateral 10/30/2015   Procedure: REPAIR OF ENTROPION SUTURES LEFT EYE  EXTENSIVE LEFT EYE;   Surgeon: Imagene Riches, MD;  Location: [facility];  Service: Ophthalmology;  Laterality: Bilateral;  . PTOSIS REPAIR Bilateral 10/30/2015   Procedure: PTOSIS REPAIR;  Surgeon: Imagene Riches, MD;  Location: [facility];  Service: Ophthalmology;  Laterality: Bilateral;    Current Outpatient Rx  . Order #: [id number] Class: Historical Med  . Order #: [id number] Class: Historical Med  . Order #: [id number] Class: Historical Med    Allergies Patient has no known allergies.  No family history on file.  Social History Social History   Tobacco Use  . Smoking status: Former Games developer  . Tobacco comment: quit 50+ yrs ago  Substance Use Topics  . Alcohol use: No  . Drug use: Not on file    Review of Systems Constitutional: No fever/chills.  Positive lightheadedness with standing.  No syncope.  No diaphoresis. Eyes: No visual changes.  No blurred or double vision.   ENT: No sore throat. No congestion or rhinorrhea.  Chronic unchanged hearing loss. Cardiovascular: Denies chest pain. Denies palpitations. Respiratory: Denies shortness of breath.  No cough. Gastrointestinal: No abdominal pain.  No nausea, no vomiting.  No diarrhea.  No constipation. Genitourinary: Negative for dysuria. Musculoskeletal: Negative for back pain.  No lower extremity swelling or calf pain. Skin: Negative for rash. Neurological: Negative for headaches. No focal numbness, tingling or weakness.     ____________________________________________   PHYSICAL EXAM:  VITAL SIGNS: ED Triage Vitals  Enc Vitals Group     BP 08/26/18 1858 (!) 155/58     Pulse Rate 08/26/18 1858 Marland Kitchen)  34     Resp 08/26/18 1858 16     Temp 08/26/18 1858 97.6 F (36.4 C)     Temp Source 08/26/18 1858 Oral     SpO2 08/26/18 1857 97 %     Weight 08/26/18 1900 138 lb 14.2 oz (63 kg)     Height 08/26/18 1900 5\' 5"  (1.651 m)     Head Circumference --      Peak Flow --      Pain Score 08/26/18 1859 0     Pain Loc --      Pain  Edu? --      Excl. in GC? --     Constitutional: Alert and oriented. Answers questions appropriately. Eyes: Conjunctivae are normal.  EOMI. No scleral icterus. Head: Atraumatic. Nose: No congestion/rhinnorhea. Mouth/Throat: Mucous membranes are moist.  Neck: No stridor.  Supple.  JVD.  No meningismus. Cardiovascular: Slow rate, regular rhythm. No murmurs, rubs or gallops.  Respiratory: Normal respiratory effort.  No accessory muscle use or retractions. Lungs CTAB.  No wheezes, rales or ronchi. Gastrointestinal: Obese.  Soft, nontender and nondistended.  No guarding or rebound.  No peritoneal signs. Musculoskeletal: No LE edema. No ttp in the calves or palpable cords.  Negative Homan's sign. Neurologic:  A&Ox3.  Speech is clear.  Face and smile are symmetric.  EOMI.  Moves all extremities well. Skin:  Skin is warm, dry and intact. No rash noted. Psychiatric: Mood and affect are normal.   ____________________________________________   LABS (all labs ordered are listed, but only abnormal results are displayed)  Labs Reviewed  BRAIN NATRIURETIC PEPTIDE - Abnormal; Notable for the following components:      Result Value   B Natriuretic Peptide 735.0 (*)    All other components within normal limits  CBC - Abnormal; Notable for the following components:   WBC 14.1 (*)    RBC 5.33 (*)    Platelets 123 (*)    All other components within normal limits  BASIC METABOLIC PANEL - Abnormal; Notable for the following components:   Glucose, Bld 129 (*)    BUN 47 (*)    Creatinine, Ser 1.45 (*)    GFR calc non Af Amer 30 (*)    GFR calc Af Amer 35 (*)    All other components within normal limits  TROPONIN I - Abnormal; Notable for the following components:   Troponin I 0.04 (*)    All other components within normal limits  URINALYSIS, COMPLETE (UACMP) WITH MICROSCOPIC   ____________________________________________  EKG  ED ECG REPORT I, Anne-Caroline Sharma Covert, the attending physician,  personally viewed and interpreted this ECG.   Date: 08/26/2018  EKG Time: 1851  Rate: 36  Rhythm: sinus bradycardia; RBBB  Axis: normal  Intervals:first-degree A-V block   ST&T Change: No STEMI; poor baseline tracing  Repeat EKG ED ECG REPORT I, Anne-Caroline Sharma Covert, the attending physician, personally viewed and interpreted this ECG.   Date: 08/26/2018  EKG Time: 1859  Rate: 34  Rhythm: sinus bradycardia; RBBB  Axis: normal  Intervals:none  ST&T Change: No STEMI  This EKG is compared to prior from 2007; the bradycardia and the right bundle branch block are new.  Repeat EKG: ED ECG REPORT I, Anne-Caroline Sharma Covert, the attending physician, personally viewed and interpreted this ECG.   Date: 08/26/2018  EKG Time: 1908  Rate: 35  Rhythm: sinus bradycardia  Axis: normal  Intervals:none  ST&T Change: No STEMI Again, this EKG has a poor baseline tracing.  I  do have some concern that there may be a P wave that is not followed by QRS and this EKG and the prior one; it is possible that she is in a junctional rhythm with a complete block.     ____________________________________________  RADIOLOGY  Dg Chest Portable 1 View  Result Date: 08/26/2018 CLINICAL DATA:  Patient with low heart rate.  Nausea. EXAM: PORTABLE CHEST 1 VIEW COMPARISON:  Chest radiograph 08/17/2006 FINDINGS: Monitoring leads overlie the patient. Pacer pad overlies the patient. Cardiomegaly. Tortuosity of the thoracic aorta. Bibasilar heterogeneous opacities. Thoracic spine degenerative changes. IMPRESSION: Cardiomegaly. Basilar heterogeneous opacities favored to represent atelectasis. Infection not excluded. Electronically Signed   By: Annia Beltrew  Davis M.D.   On: 08/26/2018 19:45    ____________________________________________   PROCEDURES  Procedure(s) performed: None  Procedures  Critical Care performed: Yes ____________________________________________   INITIAL IMPRESSION / ASSESSMENT AND PLAN / ED  COURSE  Pertinent labs & imaging results that were available during my care of the patient were reviewed by me and considered in my medical decision making (see chart for details).  84 y.o. female, otherwise healthy, presenting w/ lightheadedness, bradycardia on arrival.  The pt is maintaining her BP, so no immediate intervention is necessary, but pads have been placed on her chest.  There are multiple possible etiologies including idiopathic bradycardia, ACS or MI, or possibly a block so I am getting an EKG for reevaluation.  The patient's laboratory studies are pending and she will be admitted to the hospital for further evaluation and treatment.  ----------------------------------------- 9:10 PM on 08/26/2018 -----------------------------------------  The patient has continued to mentate normally but does continue to feel lightheaded.  I have given her 1/2 mg of atropine which improved her heart rate into the 40s, not a big change.  She continues to maintain normal blood pressures.  Her laboratory studies show a troponin of 0.04 which will need to be trended.  An aspirin has been ordered.  The patient has a white blood cell count of 14 and what appears to be atelectasis on her chest x-ray; clinically, she has not had any cough or cold symptoms, fever or chills; pneumonia is unlikely and her clinical course will continue to be monitored for this.  At this time, plan to admit the patient to the hospital for further evaluation and treatment.  CRITICAL CARE Performed by: Rockne MenghiniAnne-Caroline Kamsiyochukwu Buist   Total critical care time: 45 minutes  Critical care time was exclusive of separately billable procedures and treating other patients.  Critical care was necessary to treat or prevent imminent or life-threatening deterioration.  Critical care was time spent personally by me on the following activities: development of treatment plan with patient and/or surrogate as well as nursing, discussions with  consultants, evaluation of patient's response to treatment, examination of patient, obtaining history from patient or surrogate, ordering and performing treatments and interventions, ordering and review of laboratory studies, ordering and review of radiographic studies, pulse oximetry and re-evaluation of patient's condition.   ____________________________________________  FINAL CLINICAL IMPRESSION(S) / ED DIAGNOSES  Final diagnoses:  Sinus bradycardia  Elevated troponin         NEW MEDICATIONS STARTED DURING THIS VISIT:  New Prescriptions   No medications on file      Rockne MenghiniNorman, Anne-Caroline, MD 08/26/18 2114

## 2018-08-27 ENCOUNTER — Inpatient Hospital Stay
Admit: 2018-08-27 | Discharge: 2018-08-27 | Disposition: A | Discharge status: Home / Self Care | Payer: PPO | Attending: Internal Medicine | Admitting: Internal Medicine

## 2018-08-27 ENCOUNTER — Ambulatory Visit (HOSPITAL_COMMUNITY)
Admission: RE | Disposition: A | Discharge status: Home / Self Care | Payer: Self-pay | Source: Ambulatory Visit | Attending: Internal Medicine

## 2018-08-27 ENCOUNTER — Ambulatory Visit (HOSPITAL_COMMUNITY)
Admission: RE | Admit: 2018-08-27 | Discharge: 2018-08-28 | Disposition: A | Discharge status: Home / Self Care | Payer: PPO | Source: Ambulatory Visit | Attending: Internal Medicine | Admitting: Internal Medicine

## 2018-08-27 DIAGNOSIS — Z9889 Other specified postprocedural states: Secondary | ICD-10-CM | POA: Insufficient documentation

## 2018-08-27 DIAGNOSIS — Z9842 Cataract extraction status, left eye: Secondary | ICD-10-CM | POA: Insufficient documentation

## 2018-08-27 DIAGNOSIS — Z9841 Cataract extraction status, right eye: Secondary | ICD-10-CM | POA: Diagnosis not present

## 2018-08-27 DIAGNOSIS — Z7189 Other specified counseling: Secondary | ICD-10-CM

## 2018-08-27 DIAGNOSIS — M1990 Unspecified osteoarthritis, unspecified site: Secondary | ICD-10-CM | POA: Diagnosis not present

## 2018-08-27 DIAGNOSIS — Z79899 Other long term (current) drug therapy: Secondary | ICD-10-CM | POA: Diagnosis not present

## 2018-08-27 DIAGNOSIS — R531 Weakness: Secondary | ICD-10-CM | POA: Insufficient documentation

## 2018-08-27 DIAGNOSIS — E119 Type 2 diabetes mellitus without complications: Secondary | ICD-10-CM | POA: Diagnosis not present

## 2018-08-27 DIAGNOSIS — Z959 Presence of cardiac and vascular implant and graft, unspecified: Secondary | ICD-10-CM

## 2018-08-27 DIAGNOSIS — Z9049 Acquired absence of other specified parts of digestive tract: Secondary | ICD-10-CM | POA: Insufficient documentation

## 2018-08-27 DIAGNOSIS — I442 Atrioventricular block, complete: Secondary | ICD-10-CM

## 2018-08-27 DIAGNOSIS — Z87891 Personal history of nicotine dependence: Secondary | ICD-10-CM | POA: Diagnosis not present

## 2018-08-27 DIAGNOSIS — R001 Bradycardia, unspecified: Secondary | ICD-10-CM

## 2018-08-27 DIAGNOSIS — Z515 Encounter for palliative care: Secondary | ICD-10-CM

## 2018-08-27 DIAGNOSIS — I10 Essential (primary) hypertension: Secondary | ICD-10-CM | POA: Insufficient documentation

## 2018-08-27 DIAGNOSIS — Z86718 Personal history of other venous thrombosis and embolism: Secondary | ICD-10-CM | POA: Insufficient documentation

## 2018-08-27 HISTORY — PX: PACEMAKER IMPLANT: EP1218

## 2018-08-27 HISTORY — DX: Atrioventricular block, complete: I44.2

## 2018-08-27 LAB — CBC
HCT: 39.9 % (ref 35.0–47.0)
Hemoglobin: 13.3 g/dL (ref 12.0–16.0)
MCH: 27.9 pg (ref 26.0–34.0)
MCHC: 33.3 g/dL (ref 32.0–36.0)
MCV: 83.7 fL (ref 80.0–100.0)
Platelets: 117 10*3/uL — ABNORMAL LOW (ref 150–440)
RBC: 4.77 MIL/uL (ref 3.80–5.20)
RDW: 14.3 % (ref 11.5–14.5)
WBC: 8.5 10*3/uL (ref 3.6–11.0)

## 2018-08-27 LAB — GLUCOSE, CAPILLARY
GLUCOSE-CAPILLARY: 100 mg/dL — AB (ref 70–99)
GLUCOSE-CAPILLARY: 97 mg/dL (ref 70–99)
Glucose-Capillary: 124 mg/dL — ABNORMAL HIGH (ref 70–99)

## 2018-08-27 LAB — URINALYSIS, COMPLETE (UACMP) WITH MICROSCOPIC
Bilirubin Urine: NEGATIVE
Glucose, UA: NEGATIVE mg/dL
Hgb urine dipstick: NEGATIVE
Ketones, ur: 5 mg/dL — AB
Leukocytes, UA: NEGATIVE
Nitrite: NEGATIVE
PROTEIN: NEGATIVE mg/dL
SPECIFIC GRAVITY, URINE: 1.02 (ref 1.005–1.030)
Squamous Epithelial / LPF: NONE SEEN (ref 0–5)
WBC UA: NONE SEEN WBC/hpf (ref 0–5)
pH: 5 (ref 5.0–8.0)

## 2018-08-27 LAB — BASIC METABOLIC PANEL
Anion gap: 7 (ref 5–15)
BUN: 50 mg/dL — AB (ref 8–23)
CHLORIDE: 112 mmol/L — AB (ref 98–111)
CO2: 24 mmol/L (ref 22–32)
CREATININE: 1.5 mg/dL — AB (ref 0.44–1.00)
Calcium: 8.6 mg/dL — ABNORMAL LOW (ref 8.9–10.3)
GFR calc Af Amer: 33 mL/min — ABNORMAL LOW (ref >60)
GFR calc non Af Amer: 29 mL/min — ABNORMAL LOW (ref >60)
Glucose, Bld: 100 mg/dL — ABNORMAL HIGH (ref 70–99)
Potassium: 4.1 mmol/L (ref 3.5–5.1)
SODIUM: 143 mmol/L (ref 135–145)

## 2018-08-27 LAB — TROPONIN I: TROPONIN I: 0.07 ng/mL — AB (ref <0.03)

## 2018-08-27 LAB — PROCALCITONIN

## 2018-08-27 LAB — ECHOCARDIOGRAM COMPLETE
Height: 61 in
Weight: 2366.86 oz

## 2018-08-27 LAB — MRSA PCR SCREENING
MRSA by PCR: NEGATIVE
MRSA by PCR: NEGATIVE

## 2018-08-27 LAB — MAGNESIUM: Magnesium: 2.4 mg/dL (ref 1.7–2.4)

## 2018-08-27 SURGERY — PACEMAKER IMPLANT

## 2018-08-27 MED ORDER — SODIUM CHLORIDE 0.9 % IV SOLN
Freq: Once | INTRAVENOUS | Status: AC
  Administered 2018-08-27: 01:00:00 via INTRAVENOUS

## 2018-08-27 MED ORDER — ACETAMINOPHEN 650 MG RE SUPP: 650.0000 mg | Freq: Four times a day (QID) | RECTAL | Status: DC | PRN

## 2018-08-27 MED ORDER — SODIUM CHLORIDE 0.9 % IV SOLN
INTRAVENOUS | Status: DC
  Administered 2018-08-27: 02:00:00 via INTRAVENOUS

## 2018-08-27 MED ORDER — ONDANSETRON HCL 4 MG PO TABS: 4.0000 mg | ORAL_TABLET | Freq: Four times a day (QID) | ORAL | Status: DC | PRN

## 2018-08-27 MED ORDER — LIDOCAINE HCL (PF) 1 % IJ SOLN
INTRAMUSCULAR | Status: DC | PRN
  Administered 2018-08-27: 60 mL

## 2018-08-27 MED ORDER — ACETAMINOPHEN 325 MG PO TABS: 325.0000 mg | ORAL_TABLET | ORAL | Status: DC | PRN

## 2018-08-27 MED ORDER — LIDOCAINE HCL 1 % IJ SOLN
INTRAMUSCULAR | Status: AC
  Filled 2018-08-27: qty 60

## 2018-08-27 MED ORDER — HEPARIN (PORCINE) IN NACL 1000-0.9 UT/500ML-% IV SOLN
INTRAVENOUS | Status: DC | PRN
  Administered 2018-08-27: 500 mL

## 2018-08-27 MED ORDER — HYDROCODONE-ACETAMINOPHEN 5-325 MG PO TABS: 1.0000 | ORAL_TABLET | ORAL | Status: DC | PRN

## 2018-08-27 MED ORDER — ONDANSETRON HCL 4 MG/2ML IJ SOLN: 4.0000 mg | Freq: Four times a day (QID) | INTRAMUSCULAR | Status: DC | PRN

## 2018-08-27 MED ORDER — TRAZODONE HCL 50 MG PO TABS: 25.0000 mg | ORAL_TABLET | Freq: Every evening | ORAL | Status: DC | PRN

## 2018-08-27 MED ORDER — TRIAMTERENE-HCTZ 37.5-25 MG PO TABS
1.0000 | ORAL_TABLET | Freq: Every day | ORAL | Status: DC
  Administered 2018-08-28: 1 via ORAL
  Filled 2018-08-27: qty 1

## 2018-08-27 MED ORDER — FAMOTIDINE 20 MG PO TABS
20.0000 mg | ORAL_TABLET | Freq: Every day | ORAL | Status: DC
  Administered 2018-08-27: 20 mg via ORAL
  Filled 2018-08-27: qty 1

## 2018-08-27 MED ORDER — SODIUM CHLORIDE 0.9 % IV SOLN
80.0000 mg | INTRAVENOUS | Status: AC
  Administered 2018-08-27: 80 mg

## 2018-08-27 MED ORDER — HEPARIN SODIUM (PORCINE) 5000 UNIT/ML IJ SOLN
5000.0000 [IU] | Freq: Three times a day (TID) | INTRAMUSCULAR | Status: DC
  Administered 2018-08-27 (×2): 5000 [IU] via SUBCUTANEOUS
  Filled 2018-08-27 (×2): qty 1

## 2018-08-27 MED ORDER — NOREPINEPHRINE 4 MG/250ML-% IV SOLN
0.0000 ug/min | INTRAVENOUS | Status: DC
  Administered 2018-08-27: 2 ug/min via INTRAVENOUS
  Filled 2018-08-27: qty 250

## 2018-08-27 MED ORDER — VITAMIN B-12 1000 MCG PO TABS
1000.0000 ug | ORAL_TABLET | Freq: Every day | ORAL | Status: DC
  Administered 2018-08-28: 1000 ug via ORAL
  Filled 2018-08-27: qty 1

## 2018-08-27 MED ORDER — VITAMIN B-12 1000 MCG PO TABS
1000.0000 ug | ORAL_TABLET | Freq: Every day | ORAL | Status: DC
  Administered 2018-08-27: 1000 ug via ORAL
  Filled 2018-08-27: qty 1

## 2018-08-27 MED ORDER — BISACODYL 5 MG PO TBEC: 5.0000 mg | DELAYED_RELEASE_TABLET | Freq: Every day | ORAL | Status: DC | PRN

## 2018-08-27 MED ORDER — DOPAMINE-DEXTROSE 3.2-5 MG/ML-% IV SOLN
0.0000 ug/kg/min | INTRAVENOUS | Status: DC
  Administered 2018-08-27: 5 ug/kg/min via INTRAVENOUS
  Filled 2018-08-27: qty 250

## 2018-08-27 MED ORDER — CEFAZOLIN SODIUM-DEXTROSE 2-4 GM/100ML-% IV SOLN
INTRAVENOUS | Status: AC
  Filled 2018-08-27: qty 100

## 2018-08-27 MED ORDER — DOCUSATE SODIUM 100 MG PO CAPS
100.0000 mg | ORAL_CAPSULE | Freq: Two times a day (BID) | ORAL | Status: DC
  Administered 2018-08-27: 100 mg via ORAL
  Filled 2018-08-27: qty 1

## 2018-08-27 MED ORDER — ACETAMINOPHEN 325 MG PO TABS: 650.0000 mg | ORAL_TABLET | Freq: Four times a day (QID) | ORAL | Status: DC | PRN

## 2018-08-27 MED ORDER — SODIUM CHLORIDE 0.9 % IV SOLN
INTRAVENOUS | Status: AC
  Filled 2018-08-27: qty 2

## 2018-08-27 MED ORDER — SODIUM CHLORIDE 0.9 % IV SOLN
INTRAVENOUS | Status: DC
  Administered 2018-08-27 (×2): via INTRAVENOUS

## 2018-08-27 MED ORDER — CEFAZOLIN SODIUM-DEXTROSE 1-4 GM/50ML-% IV SOLN
1.0000 g | Freq: Four times a day (QID) | INTRAVENOUS | Status: AC
  Administered 2018-08-27 – 2018-08-28 (×3): 1 g via INTRAVENOUS
  Filled 2018-08-27 (×3): qty 50

## 2018-08-27 MED ORDER — ATROPINE SULFATE 1 MG/10ML IJ SOSY
1.0000 mg | PREFILLED_SYRINGE | INTRAMUSCULAR | Status: DC | PRN
  Administered 2018-08-27: 1 mg via INTRAVENOUS
  Filled 2018-08-27: qty 10

## 2018-08-27 MED ORDER — HEPARIN (PORCINE) IN NACL 1000-0.9 UT/500ML-% IV SOLN
INTRAVENOUS | Status: AC
  Filled 2018-08-27: qty 500

## 2018-08-27 MED ORDER — CEFAZOLIN SODIUM-DEXTROSE 2-4 GM/100ML-% IV SOLN
2.0000 g | INTRAVENOUS | Status: AC
  Administered 2018-08-27: 2 g via INTRAVENOUS

## 2018-08-27 MED ORDER — SODIUM CHLORIDE 0.9 % IV SOLN: INTRAVENOUS | Status: AC

## 2018-08-27 SURGICAL SUPPLY — 8 items
CABLE SURGICAL S-101-97-12 (CABLE) ×3 IMPLANT
HEMOSTAT SURGICEL 2X4 FIBR (HEMOSTASIS) ×3 IMPLANT
LEAD ISOFLEX OPT 1944-46CM (Lead) ×3 IMPLANT
LEAD ISOFLEX OPT 1948-52CM (Lead) ×3 IMPLANT
PACEMAKER ASSURITY DR-RF (Pacemaker) ×3 IMPLANT
PAD PRO RADIOLUCENT 2001M-C (PAD) ×3 IMPLANT
SHEATH CLASSIC 7F (SHEATH) ×6 IMPLANT
TRAY PACEMAKER INSERTION (PACKS) ×3 IMPLANT

## 2018-08-27 NOTE — Progress Notes (Addendum)
Pt in cath holding via [facility]. Awake,answering questions and mildly confused as stated by family members present. Monitored on Zoll with pads in place. Awaiting insertion of cardiac pacemaker. Has experienced symptomatic bradycardia and is in 2:1 AV Block. Dr Graciela HusbandsKlein in to speak with patient and family. Informed consent signed by daughter. To room 4.

## 2018-08-27 NOTE — Progress Notes (Signed)
Notified Dr. Lonn Georgiaonforti that patient is confused times 4.  Per patient's family -patient's baseline is alert and oriented and patient lives on her own.  Pt does follow commands and speech is clear.  Per Dr. Lonn Georgiaonforti no CT of head at this time- continue to monitor patient.

## 2018-08-27 NOTE — Progress Notes (Signed)
[facility] Per Dr. Juliann Paresallwood pt accepting physican is [facility]  Pt admitting straight to the cath lab for pacemaker placement.

## 2018-08-27 NOTE — Progress Notes (Signed)
*  PRELIMINARY RESULTS* Echocardiogram 2D Echocardiogram has been performed.  Erin GulaJoan M Saiquan Browning 08/27/2018, 9:11 AM

## 2018-08-27 NOTE — Discharge Summary (Signed)
Physician Discharge Summary  Patient ID: Erin Browning MRN: [id number] DOB/AGE: [id number] 82 y.o.  Admit date: 08/26/2018 Discharge date: 08/27/2018  Admission Diagnoses: Heart block Discharge Diagnoses:  Active Problems:   Symptomatic bradycardia high-grade AV nodal block  Discharged Condition: Stable   Hospital Course:   84 yo female with a PMH of DVT, Diet Controlled Diabetes Mellitus, and Arthritis.  [facility] with bradycardia hr 20-30's, lightheadedness, and N/V/D.  Upon arrival to the ER EKG  revealed 2:1 heart block, hr 35 bpm and no ST elevation.   She received atropine x2 doses, however hr remained in the 30's with systolic bp 105-120's.  She does not take beta blockers or antiarrhythmic medications.  Lab results revealed BNP 735, troponin 0.04, and wbc 14.1.  CXR revealed basilar opacities likely secondary to atelectasis.  She was subsequently admitted to the stepdown unit by hospitalist team for further workup and treatment Consultation with cardiology obtained who felt that patient needs permanent pacemaker implantation. [facility] to Dr. Graciela HusbandsKlein for pacemaker   Consults: Cardiology  Significant Diagnostic Studies: Echocardiogram  Discharge Exam: Blood pressure (!) 125/55, pulse (!) 33, temperature 97.6 F (36.4 C), temperature source Axillary, resp. rate 18, height 5\' 1"  (1.549 m), weight 67.1 kg, SpO2 96 %. General: well developed, well nourished elderly female, NAD  Neuro: disoriented to situation at times, PERRLA, follows commands, HOH  HEENT: supple, no JVD  Cardiovascular: bradycardic, no R/G  Lungs: clear throughout, even, non labored  Abdomen: +BS x4, obese, soft, non tender, non distended  Musculoskeletal: 1+ bilateral lower extremity edema  Skin: intact no rashes or lesions   Disposition:   [facility] Graciela HusbandsKlein has accepted patient   Tora KindredJohn Viaan Knippenberg, DO Signed: Raley Novicki 08/27/2018,  2:19 PM

## 2018-08-27 NOTE — Consult Note (Signed)
Consultation Note Date: 08/27/2018   Patient Name: Erin Browning  DOB: 12/05/1923  MRN: [id number]  Age / Sex: 84 y.o., female  PCP: Danella PentonMiller, Mark F, MD Referring Physician: Ramonita LabGouru, Aruna, MD  Reason for Consultation: Establishing goals of care  HPI/Patient Profile: Erin MirzaBetty Kipper  is a 84 y.o. female with a known history of osteoarthritis and DVT many years ago. Patient was brought to emergency room for lightheadedness, nausea, vomiting and diarrhea yesterday, at home.   Upon evaluation in the emergency room, patient was noted with bradycardia, heart rate in 20s and 2-1 heart block.  She received atropine in the emergency room, 2 doses with very minimal improvement.  Clinical Assessment and Goals of Care: Patient is resting in bed. Daughters are at bedside.   Ms. Marlise EvesHurlocker lives alone. She has been healthy only taking vitamins. A week ago week ago, she was grocery shopping, cooking and cleaning her home. She began feeling more fatigued, and developed vomiting and diarrhea yesterday and was brought in for evaluation.  The family is waiting to see cardiology as they are hopeful for a pacemaker that will restore her quality of life. They confirm the DNR status.     SUMMARY OF RECOMMENDATIONS   Patient and family desiring a pacemaker as her quality of life was good prior to this event.    Code Status/Advance Care Planning:  DNR    Symptom Management:   Per primary team.   Palliative Prophylaxis:   Eye Care and Oral Care  Prognosis:   Poor without pacemaker.   Discharge Planning: To Be Determined      Primary Diagnoses: Present on Admission: . Symptomatic bradycardia   I have reviewed the medical record, interviewed the patient and family, and examined the patient. The following aspects are pertinent.  Past Medical History:  Diagnosis Date  . Arthritis    ankles  . Blood clot in  vein    left leg, several yrs ago  . Wears dentures    full upper and lower  . Wears hearing aid    bilateral   Social History   Socioeconomic History  . Marital status: Widowed    Spouse name: Not on file  . Number of children: Not on file  . Years of education: Not on file  . Highest education level: Not on file  Occupational History  . Not on file  Social Needs  . Financial resource strain: Not on file  . Food insecurity:    Worry: Not on file    Inability: Not on file  . Transportation needs:    Medical: Not on file    Non-medical: Not on file  Tobacco Use  . Smoking status: Former Games developermoker  . Tobacco comment: quit 50+ yrs ago  Substance and Sexual Activity  . Alcohol use: No  . Drug use: Not on file  . Sexual activity: Not on file  Lifestyle  . Physical activity:    Days per week: Not on file    Minutes per session: Not on  file  . Stress: Not on file  Relationships  . Social connections:    Talks on phone: Not on file    Gets together: Not on file    Attends religious service: Not on file    Active member of club or organization: Not on file    Attends meetings of clubs or organizations: Not on file    Relationship status: Not on file  Other Topics Concern  . Not on file  Social History Narrative  . Not on file   No family history on file. Scheduled Meds: . docusate sodium  100 mg Oral BID  . famotidine  20 mg Oral Daily  . heparin  5,000 Units Subcutaneous Q8H  . vitamin B-12  1,000 mcg Oral Daily   Continuous Infusions: . sodium chloride 75 mL/hr at 08/27/18 0523  . norepinephrine (LEVOPHED) Adult infusion 3 mcg/min (08/27/18 0747)   PRN Meds:.acetaminophen **OR** acetaminophen, atropine, bisacodyl, HYDROcodone-acetaminophen, ondansetron **OR** ondansetron (ZOFRAN) IV, traZODone Medications Prior to Admission:  Prior to Admission medications   Medication Sig Start Date End Date Taking? Authorizing Provider  triamterene-hydrochlorothiazide  (MAXZIDE-25) 37.5-25 MG tablet Take 1 tablet by mouth daily. 09/10/17  Yes [provider]  vitamin B-12 (CYANOCOBALAMIN) 1000 MCG tablet Take 1,000 mcg by mouth daily. AM   Yes [provider]   No Known Allergies Review of Systems  Gastrointestinal: Positive for diarrhea, nausea and vomiting.  Neurological: Positive for weakness and light-headedness.    Physical Exam  Constitutional: No distress.  Pulmonary/Chest: Effort normal.  Neurological: She is alert.    Vital Signs: BP (!) 106/47   Pulse (!) 34   Temp 97.9 F (36.6 C) (Axillary)   Resp 15   Ht 5\' 1"  (1.549 m)   Wt 67.1 kg   SpO2 96%   BMI 27.95 kg/m  Pain Scale: 0-10 POSS *See Group Information*: 1-Acceptable,Awake and alert Pain Score: 0-No pain   SpO2: SpO2: 96 % O2 Device:SpO2: 96 % O2 Flow Rate: .   IO: Intake/output summary:   Intake/Output Summary (Last 24 hours) at 08/27/2018 1234 Last data filed at 08/27/2018 1034 Gross per 24 hour  Intake 547.4 ml  Output 280 ml  Net 267.4 ml    LBM:   Baseline Weight: Weight: 63 kg Most recent weight: Weight: 67.1 kg     Palliative Assessment/Data:      Time In: 11:00 Time Out: 11:40 Time Total: 40 min Greater than 50%  of this time was spent counseling and coordinating care related to the above assessment and plan.  Signed by: Morton Stall, NP   Please contact Palliative Medicine Team phone at [phone] for questions and concerns.  For individual provider: See Loretha Stapler

## 2018-08-27 NOTE — H&P (Signed)
Patient Care Team: Danella Penton, MD as PCP - General (Internal Medicine)   HPI  Erin Browning is a 84 y.o. female From [place] where she presented last night with a 2-week history of weakness and was found to be in complete heart block.  She denies chest pain or palpitations.  She has had presyncope.  She was given atropine in the emergency room without response.  Blood pressures been stable in the 105-120 range.     She takes only B12.  Past medical history is notable for clot in her leg.  Not on anticoagulation.  Records and Results Reviewed   Past Medical History:  Diagnosis Date  . Arthritis    ankles  . Blood clot in vein    left leg, several yrs ago  . Wears dentures    full upper and lower  . Wears hearing aid    bilateral    Past Surgical History:  Procedure Laterality Date  . BROW LIFT Bilateral 10/30/2015   Procedure: BLEPHAROPLASTY;  Surgeon: Imagene Riches, MD;  Location: [facility];  Service: Ophthalmology;  Laterality: Bilateral;  . CATARACT EXTRACTION W/PHACO Left 01/16/2016   Procedure: CATARACT EXTRACTION PHACO AND INTRAOCULAR LENS PLACEMENT (IOC);  Surgeon: Lockie Mola, MD;  Location: [facility];  Service: Ophthalmology;  Laterality: Left;  . CATARACT EXTRACTION W/PHACO Right 02/18/2016   Procedure: CATARACT EXTRACTION PHACO AND INTRAOCULAR LENS PLACEMENT (IOC);  Surgeon: Lockie Mola, MD;  Location: [facility];  Service: Ophthalmology;  Laterality: Right;  . CHOLECYSTECTOMY    . COLONOSCOPY    . ENTROPIAN REPAIR Bilateral 10/30/2015   Procedure: REPAIR OF ENTROPION SUTURES LEFT EYE  EXTENSIVE LEFT EYE;  Surgeon: Imagene Riches, MD;  Location: [facility];  Service: Ophthalmology;  Laterality: Bilateral;  . PTOSIS REPAIR Bilateral 10/30/2015   Procedure: PTOSIS REPAIR;  Surgeon: Imagene Riches, MD;  Location: [facility];  Service: Ophthalmology;  Laterality: Bilateral;    No current  facility-administered medications for this encounter.    Current Outpatient Medications  Medication Sig Dispense Refill  . triamterene-hydrochlorothiazide (MAXZIDE-25) 37.5-25 MG tablet Take 1 tablet by mouth daily.    . vitamin B-12 (CYANOCOBALAMIN) 1000 MCG tablet Take 1,000 mcg by mouth daily. AM     Facility-Administered Medications Ordered in Other Encounters  Medication Dose Route Frequency Provider Last Rate Last Dose  . 0.9 %  sodium chloride infusion   Intravenous Continuous Callwood, Dwayne D, MD 100 mL/hr at 08/27/18 1339    . 0.9 %  sodium chloride infusion   Intravenous Continuous Callwood, Dwayne D, MD 100 mL/hr at 08/27/18 1425    . acetaminophen (TYLENOL) tablet 650 mg  650 mg Oral Q6H PRN Cammy Copa, MD       Or  . acetaminophen (TYLENOL) suppository 650 mg  650 mg Rectal Q6H PRN Cammy Copa, MD      . atropine 1 MG/10ML injection 1 mg  1 mg Intravenous PRN Eugenie Norrie, NP   1 mg at 08/27/18 0542  . bisacodyl (DULCOLAX) EC tablet 5 mg  5 mg Oral Daily PRN Cammy Copa, MD      . docusate sodium (COLACE) capsule 100 mg  100 mg Oral BID Cammy Copa, MD   100 mg at 08/27/18 0936  . famotidine (PEPCID) tablet 20 mg  20 mg Oral Daily Conforti, John, DO   20 mg at 08/27/18 1349  . heparin injection 5,000 Units  5,000 Units Subcutaneous Q8H  Cammy CopaMaier, Angela, MD   5,000 Units at 08/27/18 1348  . HYDROcodone-acetaminophen (NORCO/VICODIN) 5-325 MG per tablet 1-2 tablet  1-2 tablet Oral Q4H PRN Cammy CopaMaier, Angela, MD      . norepinephrine (LEVOPHED) 4mg  in D5W 250mL premix infusion  0-40 mcg/min Intravenous Titrated Eugenie NorrieBlakeney, Dana G, NP 11.25 mL/hr at 08/27/18 0747 3 mcg/min at 08/27/18 0747  . ondansetron (ZOFRAN) tablet 4 mg  4 mg Oral Q6H PRN Cammy CopaMaier, Angela, MD       Or  . ondansetron [facility](ZOFRAN) injection 4 mg  4 mg Intravenous Q6H PRN Cammy CopaMaier, Angela, MD      . traZODone (DESYREL) tablet 25 mg  25 mg Oral QHS PRN Cammy CopaMaier, Angela, MD      . vitamin B-12 (CYANOCOBALAMIN) tablet 1,000  mcg  1,000 mcg Oral Daily Cammy CopaMaier, Angela, MD   1,000 mcg at 08/27/18 0936    No Known Allergies    Social History   Tobacco Use  . Smoking status: Former Games developermoker  . Tobacco comment: quit 50+ yrs ago  Substance Use Topics  . Alcohol use: No  . Drug use: Not on file     No family history on file.   No outpatient medications have been marked as taking for the 08/27/18 encounter [facility](Hospital Encounter).     Review of Systems negative except from HPI and PMH  Physical Exam BP 125/55 p 34 Well developed and well nourished in no acute distress HENT normal E scleral and icterus clear Neck Supple JVP flat; carotids brisk and full Clear to ausculation Slow but Regular rate and rhythm, no murmurs gallops or rub Soft with active bowel sounds No clubbing cyanosis Trace Edema Alert and oriented, grossly normal motor and sensory function Skin Warm and Dry  ECG shows 3-1 heart block with right bundle branch block escape Last previous ECG 2007 reviewed sinus rhythm  Assessment and  Plan High-grade heart block-symptomatic  The patient has 3: 1 heart block.  She is symptomatic with presyncope exercise intolerance and shortness of breath.  She is on no medications that might cause this, hence no obvious reversible features.  Blood work was unrevealing for potentially reversible metabolic or ischemic causes.  Patient is indicated for relief of symptoms.  The benefits and risks were reviewed including but not limited to death,  perforation, infection, lead dislodgement and device malfunction.  The patient understands agrees and is willing to proceed.

## 2018-08-27 NOTE — Discharge Instructions (Signed)
° ° °  Supplemental Discharge Instructions for  Pacemaker/Defibrillator Patients  Activity No heavy lifting or vigorous activity with your left/right arm for 6 to 8 weeks.  Do not raise your left/right arm above your head for one week.  Gradually raise your affected arm as drawn below.             08/31/18                     09/01/18                    09/02/18                   09/03/18 __  NO DRIVING for  1 week  ; you may begin driving on  8/46/969/20/19   .  WOUND CARE - Keep the wound area clean and dry.  Do not get this area wet for one week. No showers for one week; you may shower on  09/03/18   . - The tape/steri-strips on your wound will fall off; do not pull them off.  No bandage is needed on the site.  DO  NOT apply any creams, oils, or ointments to the wound area. - If you notice any drainage or discharge from the wound, any swelling or bruising at the site, or you develop a fever > 101? F after you are discharged home, call the office at once.  Special Instructions - You are still able to use cellular telephones; use the ear opposite the side where you have your pacemaker/defibrillator.  Avoid carrying your cellular phone near your device. - When traveling through airports, show security personnel your identification card to avoid being screened in the metal detectors.  Ask the security personnel to use the hand wand. - Avoid arc welding equipment, MRI testing (magnetic resonance imaging), TENS units (transcutaneous nerve stimulators).  Call the office for questions about other devices. - Avoid electrical appliances that are in poor condition or are not properly grounded. - Microwave ovens are safe to be near or to operate.

## 2018-08-28 ENCOUNTER — Ambulatory Visit (HOSPITAL_COMMUNITY): Payer: PPO

## 2018-08-28 ENCOUNTER — Encounter (HOSPITAL_COMMUNITY): Payer: Self-pay

## 2018-08-28 ENCOUNTER — Other Ambulatory Visit: Payer: Self-pay

## 2018-08-28 DIAGNOSIS — I442 Atrioventricular block, complete: Secondary | ICD-10-CM | POA: Diagnosis not present

## 2018-08-28 DIAGNOSIS — I10 Essential (primary) hypertension: Secondary | ICD-10-CM | POA: Diagnosis not present

## 2018-08-28 DIAGNOSIS — R531 Weakness: Secondary | ICD-10-CM | POA: Diagnosis not present

## 2018-08-28 DIAGNOSIS — Z86718 Personal history of other venous thrombosis and embolism: Secondary | ICD-10-CM | POA: Diagnosis not present

## 2018-08-28 DIAGNOSIS — E119 Type 2 diabetes mellitus without complications: Secondary | ICD-10-CM | POA: Diagnosis not present

## 2018-08-28 DIAGNOSIS — Z79899 Other long term (current) drug therapy: Secondary | ICD-10-CM | POA: Diagnosis not present

## 2018-08-28 DIAGNOSIS — R001 Bradycardia, unspecified: Secondary | ICD-10-CM | POA: Diagnosis not present

## 2018-08-28 DIAGNOSIS — Z87891 Personal history of nicotine dependence: Secondary | ICD-10-CM | POA: Diagnosis not present

## 2018-08-28 DIAGNOSIS — M1990 Unspecified osteoarthritis, unspecified site: Secondary | ICD-10-CM | POA: Diagnosis not present

## 2018-08-28 DIAGNOSIS — Z9049 Acquired absence of other specified parts of digestive tract: Secondary | ICD-10-CM | POA: Diagnosis not present

## 2018-08-28 DIAGNOSIS — Z9842 Cataract extraction status, left eye: Secondary | ICD-10-CM | POA: Diagnosis not present

## 2018-08-28 DIAGNOSIS — Z950 Presence of cardiac pacemaker: Secondary | ICD-10-CM | POA: Diagnosis not present

## 2018-08-28 DIAGNOSIS — Z9841 Cataract extraction status, right eye: Secondary | ICD-10-CM | POA: Diagnosis not present

## 2018-08-28 MED ORDER — CHLORHEXIDINE GLUCONATE 4 % EX LIQD: 60.0000 mL | Freq: Once | CUTANEOUS | Status: DC

## 2018-08-28 MED ORDER — SODIUM CHLORIDE 0.9 % IV SOLN: INTRAVENOUS | Status: DC

## 2018-08-28 NOTE — Progress Notes (Signed)
Progress Note  Patient Name: Erin MirzaBetty Browning Date of Encounter: 08/28/2018  Primary Cardiologist: No primary care provider on file.   Subjective   The patient notes minimal incisional discomfort.   Inpatient Medications    Scheduled Meds: . triamterene-hydrochlorothiazide  1 tablet Oral Daily  . vitamin B-12  1,000 mcg Oral Daily   Continuous Infusions: .  ceFAZolin (ANCEF) IV Stopped (08/28/18 0553)   PRN Meds: acetaminophen, ondansetron (ZOFRAN) IV   Vital Signs    Vitals:   08/28/18 0730 08/28/18 0800 08/28/18 0820 08/28/18 0900  BP: (!) 145/67 (!) 140/59    Pulse: (!) 102   (!) 104  Resp: (!) 21 (!) 21  (!) 28  Temp:   98 F (36.7 C)   TempSrc:   Oral   SpO2: 95%   (!) 89%    Intake/Output Summary (Last 24 hours) at 08/28/2018 1105 Last data filed at 08/28/2018 0800 Gross per 24 hour  Intake 280.5 ml  Output 50 ml  Net 230.5 ml   There were no vitals filed for this visit.  Telemetry    Normal sinus rhythm with AV sequential pacing- Personally Reviewed  ECG    Normal sinus rhythm with AV sequential pacing- Personally Reviewed  Physical Exam   GEN: No acute distress.   Neck: No JVD Cardiac: RRR, no murmurs, rubs, or gallops.  Respiratory: Clear to auscultation bilaterally.  No hematoma GI: Soft, nontender, non-distended  MS: No edema; No deformity. Neuro:  Nonfocal  Psych: Normal affect   Labs    Chemistry Recent Labs  Lab 08/26/18 2007 08/27/18 0320  NA 143 143  K 4.6 4.1  CL 110 112*  CO2 27 24  GLUCOSE 129* 100*  BUN 47* 50*  CREATININE 1.45* 1.50*  CALCIUM 9.0 8.6*  GFRNONAA 30* 29*  GFRAA 35* 33*  ANIONGAP 6 7     Hematology Recent Labs  Lab 08/26/18 1911 08/27/18 0320  WBC 14.1* 8.5  RBC 5.33* 4.77  HGB 14.7 13.3  HCT 44.7 39.9  MCV 83.9 83.7  MCH 27.6 27.9  MCHC 32.8 33.3  RDW 14.4 14.3  PLT 123* 117*    Cardiac Enzymes Recent Labs  Lab 08/26/18 2007 08/27/18 0320  TROPONINI 0.04* 0.07*   No results  for input(s): TROPIPOC in the last 168 hours.   BNP Recent Labs  Lab 08/26/18 1911  BNP 735.0*     DDimer No results for input(s): DDIMER in the last 168 hours.   Radiology    Dg Chest 2 View  Result Date: 08/28/2018 CLINICAL DATA:  Order for Cardiac device in Situ Pacemaker inserted yesterday. EXAM: CHEST - 2 VIEW COMPARISON:  08/26/2018 FINDINGS: Interval placement of left subclavian dual lead transvenous pacemaker, lead tips extending towards the right atrium and right ventricular apex. No pneumothorax. Lungs clear. Heart size and mediastinal contours are within normal limits. No effusion. Visualized bones unremarkable. IMPRESSION: 1. Left subclavian pacemaker place without pneumothorax. Electronically Signed   By: Corlis Leak  Hassell M.D.   On: 08/28/2018 09:01   Dg Chest Portable 1 View  Result Date: 08/26/2018 CLINICAL DATA:  Patient with low heart rate.  Nausea. EXAM: PORTABLE CHEST 1 VIEW COMPARISON:  Chest radiograph 08/17/2006 FINDINGS: Monitoring leads overlie the patient. Pacer pad overlies the patient. Cardiomegaly. Tortuosity of the thoracic aorta. Bibasilar heterogeneous opacities. Thoracic spine degenerative changes. IMPRESSION: Cardiomegaly. Basilar heterogeneous opacities favored to represent atelectasis. Infection not excluded. Electronically Signed   By: Annia Beltrew  Davis M.D.   On: 08/26/2018  19:45    Cardiac Studies   Chest x-ray demonstrates satisfactory pacemaker lead location  Patient Profile     84 y.o. female admitted with complete heart block, status post pacemaker insertion  Assessment & Plan    1.  Permanent pacemaker insertion -the patient is doing well.  Pacemaker interrogation under my direct supervision demonstrates normal device function.  Her leads are in satisfactory location.  She is stable for discharge.  Usual follow-up. 2.  Complete heart block -she is stable after pacemaker insertion. For questions or updates, please contact [facility] Please consult  [email or web address] for contact info under Cardiology/STEMI.      Signed, Lewayne Bunting, MD  08/28/2018, 11:05 AM  Patient ID: Erin Browning, female   DOB: [birth year], 84 y.o.   MRN: [id number]

## 2018-08-28 NOTE — Discharge Summary (Addendum)
Discharge Summary    Patient ID: Erin Browning MRN: [id number]; DOB: 04-Apr-1923  Admit date: 08/27/2018 Discharge date: 08/28/2018  Primary Care Provider: Danella PentonMiller, Mark F, MD  Primary Electrophysiologist:  Dr. Graciela HusbandsKlein  Discharge Diagnoses    Active Problems:   Complete heart block (HCC)   Symptomatic bradycardia   HTN  Allergies No Known Allergies  Diagnostic Studies/Procedures    PACEMAKER IMPLANT  Procedural Details/Technique   Technical Details Erin Browning [id number] @CSN @  Preop LO:VFIEPPIRx:complete heart block Postop Dx same/  Procedure: dual chamber pacemaker insertion  After routine prep and drape, lidocaine was infiltrated in the prepectoral subclavicular region on the left side an incision was made and carried down to layer of the prepectoral fascia using electrocautery and sharp dissection; a pocket was formed similarly. Hemostasis was obtained.  After this, we turned our attention to gaining access to the extrathoracic,left subclavian vein. This was accomplished without difficulty and without the aspiration of air or puncture of the artery. 2 separate venipunctures were accomplished; guidewires were placed and retained and sequentially 7 French sheaths were placed through which were passed a [employer] 1948 ventricular lead, serial number [id number] and a St Jude [id number] atrial lead, serial number [id number].  The ventricular lead was manipulated to the right ventricular apex where the bipolar R wave was 8.4, the pacing impedance was 847, the threshold was <1.0 V @ 0.4 msec . Current at threshold was 1.0 ma.  The right atrial lead was manipulated to the right atrial appendage where the bipolar P-wave was 2.2, the pacing impedance was 647, the threshold was nOT TESTED ma  The leads were affixed to the prepectoral fascia and attached to a St Jude pulse generator. Serial number [id number]  Hemostasis was obtained. The pocket was copiously irrigated with antibiotic containing saline  solution. The leads and the pulse generator were placed in the pocket and affixed to the prepectoral fascia. The wound iwas then closed in 2 layers in normal fashion. The wound was washed dried and a dermabond was applied. Needle count, sponge count and instrument counts were correct at the end of the procedure. The patient tolerated the procedure without apparent complication.   History of Present Illness     84 yo female with a PMH of DVT, Diet Controlled Diabetes Mellitus, and Arthritis [facility] with 2 weeks hx of weakness. Noted in complete heart block.Given atropine x2 doses, however hr remained in the 30's with systolic bp 105-120's. Not on any AV blocking agent. No LOC. No reversible metabolic or ischemic causes. [facility] Dr. Graciela HusbandsKlein for pacemaker.  Hospital Course     Consultants: None  She was taken to cath lab and underwent dual chamber St Jude pacemaker insertion. No overnight complication. Normal device function. Chest xray without pneumothorax. Seen by Dr. Ladona Ridgelaylor today and deemed stable for discharge. Follow up has been arranged.   Discharge Vitals Blood pressure (!) 127/104, pulse 90, temperature (!) 97.5 F (36.4 C), temperature source Oral, resp. rate (!) 21, height 5\' 1"  (1.549 m), weight 66.7 kg, SpO2 92 %.  Filed Weights   08/28/18 1141  Weight: 66.7 kg    Labs & Radiologic Studies    CBC Recent Labs    08/26/18 1911 08/27/18 0320  WBC 14.1* 8.5  HGB 14.7 13.3  HCT 44.7 39.9  MCV 83.9 83.7  PLT 123* 117*   Basic Metabolic Panel Recent Labs    51/88/4108/12/02 2007 08/27/18 0320  NA 143 143  K 4.6 4.1  CL 110 112*  CO2 27 24  GLUCOSE 129* 100*  BUN 47* 50*  CREATININE 1.45* 1.50*  CALCIUM 9.0 8.6*  MG  --  2.4  Cardiac Enzymes Recent Labs    08/26/18 2007 08/27/18 0320  TROPONINI 0.04* 0.07*   _____________  Dg Chest 2 View  Result Date: 08/28/2018 CLINICAL DATA:  Order for Cardiac device in Situ Pacemaker  inserted yesterday. EXAM: CHEST - 2 VIEW COMPARISON:  08/26/2018 FINDINGS: Interval placement of left subclavian dual lead transvenous pacemaker, lead tips extending towards the right atrium and right ventricular apex. No pneumothorax. Lungs clear. Heart size and mediastinal contours are within normal limits. No effusion. Visualized bones unremarkable. IMPRESSION: 1. Left subclavian pacemaker place without pneumothorax. Electronically Signed   By: Corlis Leak M.D.   On: 08/28/2018 09:01   Dg Chest Portable 1 View  Result Date: 08/26/2018 CLINICAL DATA:  Patient with low heart rate.  Nausea. EXAM: PORTABLE CHEST 1 VIEW COMPARISON:  Chest radiograph 08/17/2006 FINDINGS: Monitoring leads overlie the patient. Pacer pad overlies the patient. Cardiomegaly. Tortuosity of the thoracic aorta. Bibasilar heterogeneous opacities. Thoracic spine degenerative changes. IMPRESSION: Cardiomegaly. Basilar heterogeneous opacities favored to represent atelectasis. Infection not excluded. Electronically Signed   By: Annia Belt M.D.   On: 08/26/2018 19:45   Disposition   Pt is being discharged home today in good condition.  Follow-up Plans & Appointments    Follow-up Information    [facility] Follow up on 09/09/2018.   Specialty:  Cardiology Why:  3:00PM, wound check visit Contact information: [address] [place] [address] [phone]       Duke Salvia, MD Follow up on 11/30/2018.   Specialty:  Cardiology Why:  11:30AM Contact information: [address] [place] [address] [phone]          Discharge Instructions    Diet - low sodium heart healthy   Complete by:  As directed    Increase activity slowly   Complete by:  As directed       Discharge Medications   Allergies as of 08/28/2018   No Known Allergies     Medication List    TAKE these medications   triamterene-hydrochlorothiazide 37.5-25 MG  tablet Commonly known as:  MAXZIDE-25 Take 1 tablet by mouth daily.   vitamin B-12 1000 MCG tablet Commonly known as:  CYANOCOBALAMIN Take 1,000 mcg by mouth daily. AM        Acute coronary syndrome (MI, NSTEMI, STEMI, etc) this admission?: No.    Outstanding Labs/Studies   None  Duration of Discharge Encounter   Greater than 30 minutes including physician time.  Lorelei Pont, PA 08/28/2018, 12:58 PM  EP Attending  Patient seen and examined. Agree with above. She is stable for DC home. Followup as above.   Lewayne Bunting, M.D.

## 2018-08-28 NOTE — Progress Notes (Signed)
Pt discharged in stable condition. All belongings sent with pt and family, including pacemaker information. AVS reviewed with family. Escorted to vehicle.  Norva KarvonenPhillip M Phyliss Hulick, RN

## 2018-08-30 ENCOUNTER — Encounter (HOSPITAL_COMMUNITY): Payer: Self-pay | Admitting: Internal Medicine

## 2018-08-30 MED FILL — Lidocaine HCl Local Inj 1%: INTRAMUSCULAR | Qty: 60 | Status: AC

## 2018-09-09 ENCOUNTER — Ambulatory Visit (INDEPENDENT_AMBULATORY_CARE_PROVIDER_SITE_OTHER): Payer: PPO | Admitting: *Deleted

## 2018-09-09 DIAGNOSIS — E119 Type 2 diabetes mellitus without complications: Secondary | ICD-10-CM | POA: Diagnosis not present

## 2018-09-09 DIAGNOSIS — I442 Atrioventricular block, complete: Secondary | ICD-10-CM

## 2018-09-09 DIAGNOSIS — R001 Bradycardia, unspecified: Secondary | ICD-10-CM

## 2018-09-09 DIAGNOSIS — E538 Deficiency of other specified B group vitamins: Secondary | ICD-10-CM | POA: Diagnosis not present

## 2018-09-09 DIAGNOSIS — E039 Hypothyroidism, unspecified: Secondary | ICD-10-CM | POA: Diagnosis not present

## 2018-09-09 LAB — CUP PACEART INCLINIC DEVICE CHECK
Battery Voltage: 3.08 V
Brady Statistic RA Percent Paced: 0.13 %
Brady Statistic RV Percent Paced: 99.52 %
Implantable Lead Implant Date: 20190913
Implantable Lead Location: 753859
Implantable Lead Model: 1944
Implantable Lead Model: 1948
Lead Channel Impedance Value: 512.5 Ohm
Lead Channel Impedance Value: 800 Ohm
Lead Channel Pacing Threshold Amplitude: 0.75 V
Lead Channel Pacing Threshold Pulse Width: 0.4 ms
Lead Channel Sensing Intrinsic Amplitude: 5 mV
Lead Channel Setting Pacing Amplitude: 5 V
Lead Channel Setting Sensing Sensitivity: 2 mV
MDC IDC LEAD IMPLANT DT: 20190913
MDC IDC LEAD LOCATION: 753860
MDC IDC MSMT BATTERY REMAINING LONGEVITY: 79 mo
MDC IDC MSMT LEADCHNL RV PACING THRESHOLD AMPLITUDE: 0.5 V
MDC IDC MSMT LEADCHNL RV PACING THRESHOLD PULSEWIDTH: 0.4 ms
MDC IDC PG IMPLANT DT: 20190913
MDC IDC SESS DTM: 20190926155828
MDC IDC SET LEADCHNL RA PACING AMPLITUDE: 3.5 V
MDC IDC SET LEADCHNL RV PACING PULSEWIDTH: 0.4 ms
Pulse Gen Model: 2272
Pulse Gen Serial Number: 9064209

## 2018-09-09 NOTE — Progress Notes (Signed)
Wound check appointment. Dermabond removed. Wound without redness or edema. Incision edges approximated, wound well healed. Normal device function. Thresholds, sensing, and impedances consistent with implant measurements. Device programmed at 3.5V for extra safety margin until 3 month visit. Histogram distribution appropriate for patient and level of activity. 2 mode switches (<1%)-- EGMs appear AT, longest 10 seconds, Peak A rate 202 bpm, Peak V rate 70 bpm. No high ventricular rates noted. Patient educated about wound care, arm mobility, lifting restrictions. ROV with SK/B 11/30/18

## 2018-09-16 DIAGNOSIS — Z23 Encounter for immunization: Secondary | ICD-10-CM | POA: Diagnosis not present

## 2018-09-16 DIAGNOSIS — E538 Deficiency of other specified B group vitamins: Secondary | ICD-10-CM | POA: Diagnosis not present

## 2018-09-16 DIAGNOSIS — E119 Type 2 diabetes mellitus without complications: Secondary | ICD-10-CM | POA: Diagnosis not present

## 2018-09-16 DIAGNOSIS — Z0000 Encounter for general adult medical examination without abnormal findings: Secondary | ICD-10-CM | POA: Diagnosis not present

## 2018-09-21 NOTE — Consult Note (Signed)
Reason for Consult: Complete heart block bradycardia symptomatic Referring Physician: Dr. Bethann Punches primary  Erin Browning is an 84 y.o. female.  HPI: Patient is a 84 year old female with osteoarthritis DVT many years ago brought to the emergency room because of lightheaded weakness fatigue over the last 24 hours this is gotten progressively worse patient also had an episode of nausea vomiting diarrhea early in the day.  She denies any fever chills or sweats or chest pain denies any previous cardiac history in emergency room she was found to be severely bradycardic with rates in the 20s thought to be at least second-degree heart block with episodes of what appeared to be complete heart block.  Patient was treated with atropine without significant improvement patient remained relatively vitally stable with elevated BNP with borderline troponins patient now here for follow-up evaluation and management because of significant bradycardic rate  Past Medical History:  Diagnosis Date  . Arthritis    ankles  . Blood clot in vein    left leg, several yrs ago  . CHB (complete heart block) (HCC)    dual chamber St Jude pacemaker insertion 08/27/18  . Wears dentures    full upper and lower  . Wears hearing aid    bilateral    Past Surgical History:  Procedure Laterality Date  . BROW LIFT Bilateral 10/30/2015   Procedure: BLEPHAROPLASTY;  Surgeon: Imagene Riches, MD;  Location: [facility];  Service: Ophthalmology;  Laterality: Bilateral;  . CATARACT EXTRACTION W/PHACO Left 01/16/2016   Procedure: CATARACT EXTRACTION PHACO AND INTRAOCULAR LENS PLACEMENT (IOC);  Surgeon: Lockie Mola, MD;  Location: [facility];  Service: Ophthalmology;  Laterality: Left;  . CATARACT EXTRACTION W/PHACO Right 02/18/2016   Procedure: CATARACT EXTRACTION PHACO AND INTRAOCULAR LENS PLACEMENT (IOC);  Surgeon: Lockie Mola, MD;  Location: [facility];  Service: Ophthalmology;  Laterality:  Right;  . CHOLECYSTECTOMY    . COLONOSCOPY    . ENTROPIAN REPAIR Bilateral 10/30/2015   Procedure: REPAIR OF ENTROPION SUTURES LEFT EYE  EXTENSIVE LEFT EYE;  Surgeon: Imagene Riches, MD;  Location: [facility];  Service: Ophthalmology;  Laterality: Bilateral;  . PACEMAKER IMPLANT N/A 08/27/2018   Procedure: PACEMAKER IMPLANT;  Surgeon: Duke Salvia, MD;  [facility]: [facility] LAB;  Service: Cardiovascular;  Laterality: N/A;  . PTOSIS REPAIR Bilateral 10/30/2015   Procedure: PTOSIS REPAIR;  Surgeon: Imagene Riches, MD;  Location: [facility];  Service: Ophthalmology;  Laterality: Bilateral;    History reviewed. No pertinent family history.  Social History:  reports that she has quit smoking. Her smoking use included cigarettes. She has never used smokeless tobacco. She reports that she does not drink alcohol. Her drug history is not on file.  Allergies: No Known Allergies  Medications: I have reviewed the patient's current medications.  No results found for this or any previous visit (from the past 48 hour(s)).  No results found.  Review of Systems  Constitutional: Positive for diaphoresis and malaise/fatigue.  HENT: Positive for congestion.   Eyes: Negative.   Respiratory: Positive for shortness of breath.   Cardiovascular: Positive for palpitations.  Gastrointestinal: Negative.   Genitourinary: Negative.   Musculoskeletal: Positive for myalgias.  Skin: Negative.   Neurological: Positive for dizziness and weakness.  Endo/Heme/Allergies: Negative.   Psychiatric/Behavioral: Negative.    Blood pressure (!) 146/81, pulse 99, temperature (!) 97.5 F (36.4 C), temperature source Oral, resp. rate (!) 24, height 5\' 1"  (1.549 m), weight 66.7 kg, SpO2 97 %.  Physical Exam  Nursing note and vitals reviewed. Constitutional: She is oriented to person, place, and time. She appears well-developed and well-nourished.  HENT:  Head: Normocephalic and atraumatic.  Eyes:  Pupils are equal, round, and reactive to light. Conjunctivae and EOM are normal.  Neck: Normal range of motion. Neck supple.  Cardiovascular: Regular rhythm, S1 normal, S2 normal and normal pulses. Bradycardia present. Exam reveals gallop.  Respiratory: Effort normal and breath sounds normal.  GI: Soft. Bowel sounds are normal.  Musculoskeletal: Normal range of motion.  Neurological: She is alert and oriented to person, place, and time. She has normal reflexes.  Skin: Skin is warm and dry.  Psychiatric: She has a normal mood and affect.    Assessment/Plan: Bradycardia Complete heart block Vertigo Generalized weakness Lightheadedness Borderline troponin Renal insufficiency Acute gastroenteritis . Plan Agree with admission to telemetry Continue intensive care monitoring External pads Will defer temporary  Pacemaker Continue blood pressure management and control Follow-up renal insufficiency Consider GI evaluation for gastroenteritis Recommend permanent pacemaker will transfer to [facility] with Dr. Clydie Braun D [facility] 09/21/2018, 4:39 PM

## 2018-11-30 ENCOUNTER — Encounter: Payer: PPO | Admitting: Internal Medicine

## 2018-11-30 ENCOUNTER — Ambulatory Visit (INDEPENDENT_AMBULATORY_CARE_PROVIDER_SITE_OTHER): Payer: PPO | Admitting: Internal Medicine

## 2018-11-30 ENCOUNTER — Encounter: Payer: Self-pay | Admitting: Internal Medicine

## 2018-11-30 VITALS — BP 117/68 | HR 84 | Ht 60.0 in | Wt 144.8 lb

## 2018-11-30 DIAGNOSIS — Z950 Presence of cardiac pacemaker: Secondary | ICD-10-CM | POA: Diagnosis not present

## 2018-11-30 DIAGNOSIS — I442 Atrioventricular block, complete: Secondary | ICD-10-CM

## 2018-11-30 DIAGNOSIS — N289 Disorder of kidney and ureter, unspecified: Secondary | ICD-10-CM

## 2018-11-30 NOTE — Progress Notes (Signed)
Patient Care Team: Danella Penton, MD as PCP - General (Internal Medicine)   HPI  Erin Browning is a 84 y.o. female Seen in follow-up for pacemaker implanted 9/19 (GT) having presented with complete heart block preceded by 2 weeks of weakness.  Date Cr K  9/19 1.5 <<(0.9) 4.1        Following pacing she is back to her normal self in terms of energy.  She has intermittent peripheral edema.  This correlates some with dyspnea.  Her diet was reviewed it is associated with copious intrinsic sodium; does not add salt  Records and Results Reviewed   Past Medical History:  Diagnosis Date  . Arthritis    ankles  . Blood clot in vein    left leg, several yrs ago  . CHB (complete heart block) (HCC)    dual chamber St Jude pacemaker insertion 08/27/18  . Wears dentures    full upper and lower  . Wears hearing aid    bilateral    Past Surgical History:  Procedure Laterality Date  . BROW LIFT Bilateral 10/30/2015   Procedure: BLEPHAROPLASTY;  Surgeon: Imagene Riches, MD;  Location: [facility];  Service: Ophthalmology;  Laterality: Bilateral;  . CATARACT EXTRACTION W/PHACO Left 01/16/2016   Procedure: CATARACT EXTRACTION PHACO AND INTRAOCULAR LENS PLACEMENT (IOC);  Surgeon: Lockie Mola, MD;  Location: [facility];  Service: Ophthalmology;  Laterality: Left;  . CATARACT EXTRACTION W/PHACO Right 02/18/2016   Procedure: CATARACT EXTRACTION PHACO AND INTRAOCULAR LENS PLACEMENT (IOC);  Surgeon: Lockie Mola, MD;  Location: [facility];  Service: Ophthalmology;  Laterality: Right;  . CHOLECYSTECTOMY    . COLONOSCOPY    . ENTROPIAN REPAIR Bilateral 10/30/2015   Procedure: REPAIR OF ENTROPION SUTURES LEFT EYE  EXTENSIVE LEFT EYE;  Surgeon: Imagene Riches, MD;  Location: [facility];  Service: Ophthalmology;  Laterality: Bilateral;  . PACEMAKER IMPLANT N/A 08/27/2018   Procedure: PACEMAKER IMPLANT;  Surgeon: Duke Salvia, MD;  Location: [facility]  INVASIVE CV LAB;  Service: Cardiovascular;  Laterality: N/A;  . PTOSIS REPAIR Bilateral 10/30/2015   Procedure: PTOSIS REPAIR;  Surgeon: Imagene Riches, MD;  Location: [facility];  Service: Ophthalmology;  Laterality: Bilateral;    Current Meds  Medication Sig  . triamterene-hydrochlorothiazide (MAXZIDE-25) 37.5-25 MG tablet Take 1 tablet by mouth daily.  . vitamin B-12 (CYANOCOBALAMIN) 1000 MCG tablet Take 1,000 mcg by mouth daily. AM    No Known Allergies    Review of Systems negative except from HPI and PMH  Physical Exam BP 117/68 (BP Location: Right Arm, Patient Position: Sitting, Cuff Size: Normal)   Pulse 84   Ht 5' (1.524 m)   Wt 144 lb 12 oz (65.7 kg)   BMI 28.27 kg/m  Well developed and well nourished in no acute distress HENT normal E scleral and icterus clear Neck Supple JVP 7-8  Clear to ausculation Device pocket well healed; without hematoma or erythema.  There is no tethering  *Regular rate and rhythm, no murmurs gallops or rub Soft with active bowel sounds No clubbing cyanosis 1+ Edema Alert and oriented, grossly normal motor and sensory function Skin Warm and Dry  ECG sinus rhythm at 84 with P synchronous pacing  Assessment and  Plan  High-grade heart block-symptomatic  Pacemaker-Saint Jude  HFpEF-chronic/acute  Device function is normal.  She is now complete heart block.  She is volume overloaded.  Lengthy discussion regarding sodium/fluid intake and the potential use  of augmented diuresis.  They would like to try sodium and salt restriction prior to more medication.  She already struggles with continence issues on diuretics  We spent more than 50% of our >25 min visit in face to face counseling regarding the above   Current medicines are reviewed at length with the patient today .  The patient does not have concerns regarding medicines.

## 2018-11-30 NOTE — Patient Instructions (Signed)
Medication Instructions:  - Your physician recommends that you continue on your current medications as directed. Please refer to the Current Medication list given to you today.  If you need a refill on your cardiac medications before your next appointment, please call your pharmacy.   Lab work: - Your physician recommends that you have lab work today: BMP   If you have labs (blood work) drawn today and your tests are completely normal, you will receive your results only by: Marland Kitchen. MyChart Message (if you have MyChart) OR . A paper copy in the mail If you have any lab test that is abnormal or we need to change your treatment, we will call you to review the results.  Testing/Procedures: - none ordered  Follow-Up: At [facility], you and your health needs are our priority.  As part of our continuing mission to provide you with exceptional heart care, we have created designated Provider Care Teams.  These Care Teams include your primary Cardiologist (physician) and Advanced Practice Providers (APPs -  Physician Assistants and Nurse Practitioners) who all work together to provide you with the care you need, when you need it. You will need a follow up appointment in September 2020 with Dr. Graciela HusbandsKlein.  Please call our office 2 months in advance to schedule this appointment.  - Remote monitoring is used to monitor your Pacemaker of ICD from home. This monitoring reduces the number of office visits required to check your device to one time per year. It allows us to keep an eye on the functioning of your device to ensure it is working properly. You are scheduled for a device check from home on 03/01/19. You may send your transmission at any time that day. If you have a wireless device, the transmission will be sent automatically. After your physician reviews your transmission, you will receive a postcard with your next transmission date.     Any Other Special Instructions Will Be Listed Below (If  Applicable). - N/A

## 2018-12-01 LAB — BASIC METABOLIC PANEL
BUN/Creatinine Ratio: 29 — ABNORMAL HIGH (ref 12–28)
BUN: 26 mg/dL (ref 10–36)
CALCIUM: 9.3 mg/dL (ref 8.7–10.3)
CO2: 23 mmol/L (ref 20–29)
CREATININE: 0.9 mg/dL (ref 0.57–1.00)
Chloride: 104 mmol/L (ref 96–106)
GFR, EST AFRICAN AMERICAN: 64 mL/min/{1.73_m2} (ref >59)
GFR, EST NON AFRICAN AMERICAN: 55 mL/min/{1.73_m2} — AB (ref >59)
Glucose: 124 mg/dL — ABNORMAL HIGH (ref 65–99)
POTASSIUM: 3.8 mmol/L (ref 3.5–5.2)
Sodium: 142 mmol/L (ref 134–144)

## 2019-02-01 LAB — CUP PACEART INCLINIC DEVICE CHECK
Battery Voltage: 3.01 V
Date Time Interrogation Session: 20191217150350
Implantable Lead Implant Date: 20190913
Implantable Lead Implant Date: 20190913
Implantable Lead Location: 753859
Implantable Lead Model: 1944
Implantable Pulse Generator Implant Date: 20190913
Lead Channel Impedance Value: 775 Ohm
Lead Channel Pacing Threshold Amplitude: 0.5 V
Lead Channel Pacing Threshold Pulse Width: 0.4 ms
Lead Channel Sensing Intrinsic Amplitude: 5 mV
Lead Channel Setting Pacing Amplitude: 0.75 V
Lead Channel Setting Pacing Amplitude: 2 V
Lead Channel Setting Pacing Pulse Width: 0.4 ms
Lead Channel Setting Sensing Sensitivity: 2 mV
MDC IDC LEAD LOCATION: 753860
MDC IDC MSMT BATTERY REMAINING LONGEVITY: 132 mo
MDC IDC MSMT LEADCHNL RA IMPEDANCE VALUE: 600 Ohm
MDC IDC MSMT LEADCHNL RA IMPEDANCE VALUE: 600 Ohm
MDC IDC MSMT LEADCHNL RA PACING THRESHOLD AMPLITUDE: 0.75 V
MDC IDC MSMT LEADCHNL RA PACING THRESHOLD PULSEWIDTH: 0.4 ms
MDC IDC MSMT LEADCHNL RV IMPEDANCE VALUE: 775 Ohm
MDC IDC STAT BRADY RA PERCENT PACED: 0.47 %
MDC IDC STAT BRADY RV PERCENT PACED: 99.63 %
Pulse Gen Model: 2272
Pulse Gen Serial Number: 9064209

## 2019-03-01 ENCOUNTER — Other Ambulatory Visit: Payer: Self-pay

## 2019-03-01 ENCOUNTER — Ambulatory Visit (INDEPENDENT_AMBULATORY_CARE_PROVIDER_SITE_OTHER): Payer: PPO | Admitting: *Deleted

## 2019-03-01 DIAGNOSIS — I442 Atrioventricular block, complete: Secondary | ICD-10-CM | POA: Diagnosis not present

## 2019-03-02 LAB — CUP PACEART REMOTE DEVICE CHECK
Battery Voltage: 3.02 V
Brady Statistic AP VP Percent: 1.8 %
Brady Statistic AS VP Percent: 97 %
Brady Statistic RA Percent Paced: 1.1 %
Brady Statistic RV Percent Paced: 99 %
Date Time Interrogation Session: 20200317084026
Implantable Lead Location: 753859
Implantable Lead Location: 753860
Implantable Lead Model: 1944
Lead Channel Impedance Value: 790 Ohm
Lead Channel Pacing Threshold Pulse Width: 0.4 ms
Lead Channel Pacing Threshold Pulse Width: 0.4 ms
Lead Channel Sensing Intrinsic Amplitude: 4 mV
Lead Channel Setting Pacing Amplitude: 2 V
Lead Channel Setting Pacing Pulse Width: 0.4 ms
Lead Channel Setting Sensing Sensitivity: 2 mV
MDC IDC LEAD IMPLANT DT: 20190913
MDC IDC LEAD IMPLANT DT: 20190913
MDC IDC MSMT BATTERY REMAINING LONGEVITY: 127 mo
MDC IDC MSMT BATTERY REMAINING PERCENTAGE: 95.5 %
MDC IDC MSMT LEADCHNL RA IMPEDANCE VALUE: 590 Ohm
MDC IDC MSMT LEADCHNL RA PACING THRESHOLD AMPLITUDE: 0.75 V
MDC IDC MSMT LEADCHNL RA SENSING INTR AMPL: 5 mV
MDC IDC MSMT LEADCHNL RV PACING THRESHOLD AMPLITUDE: 0.625 V
MDC IDC PG IMPLANT DT: 20190913
MDC IDC SET LEADCHNL RV PACING AMPLITUDE: 0.875
MDC IDC STAT BRADY AP VS PERCENT: 1 %
MDC IDC STAT BRADY AS VS PERCENT: 1 %
Pulse Gen Model: 2272
Pulse Gen Serial Number: 9064209

## 2019-03-09 ENCOUNTER — Encounter: Payer: Self-pay | Admitting: Cardiology

## 2019-03-09 NOTE — Progress Notes (Signed)
Remote pacemaker transmission.   

## 2019-04-29 DIAGNOSIS — E119 Type 2 diabetes mellitus without complications: Secondary | ICD-10-CM | POA: Diagnosis not present

## 2019-04-29 DIAGNOSIS — E538 Deficiency of other specified B group vitamins: Secondary | ICD-10-CM | POA: Diagnosis not present

## 2019-05-06 DIAGNOSIS — Z0000 Encounter for general adult medical examination without abnormal findings: Secondary | ICD-10-CM | POA: Diagnosis not present

## 2019-05-06 DIAGNOSIS — E538 Deficiency of other specified B group vitamins: Secondary | ICD-10-CM | POA: Diagnosis not present

## 2019-05-06 DIAGNOSIS — E039 Hypothyroidism, unspecified: Secondary | ICD-10-CM | POA: Diagnosis not present

## 2019-05-06 DIAGNOSIS — E119 Type 2 diabetes mellitus without complications: Secondary | ICD-10-CM | POA: Diagnosis not present

## 2019-05-31 ENCOUNTER — Ambulatory Visit (INDEPENDENT_AMBULATORY_CARE_PROVIDER_SITE_OTHER): Payer: PPO | Admitting: *Deleted

## 2019-05-31 DIAGNOSIS — I442 Atrioventricular block, complete: Secondary | ICD-10-CM | POA: Diagnosis not present

## 2019-05-31 LAB — CUP PACEART REMOTE DEVICE CHECK
Battery Remaining Longevity: 127 mo
Battery Remaining Percentage: 95.5 %
Battery Voltage: 3.02 V
Brady Statistic AP VP Percent: 2.2 %
Brady Statistic AP VS Percent: 1 %
Brady Statistic AS VP Percent: 97 %
Brady Statistic AS VS Percent: 1 %
Brady Statistic RA Percent Paced: 1.5 %
Brady Statistic RV Percent Paced: 99 %
Date Time Interrogation Session: 20200616072647
Implantable Lead Implant Date: 20190913
Implantable Lead Implant Date: 20190913
Implantable Lead Location: 753859
Implantable Lead Location: 753860
Implantable Lead Model: 1944
Implantable Lead Model: 1948
Implantable Pulse Generator Implant Date: 20190913
Lead Channel Impedance Value: 600 Ohm
Lead Channel Impedance Value: 790 Ohm
Lead Channel Pacing Threshold Amplitude: 0.625 V
Lead Channel Pacing Threshold Amplitude: 0.75 V
Lead Channel Pacing Threshold Pulse Width: 0.4 ms
Lead Channel Pacing Threshold Pulse Width: 0.4 ms
Lead Channel Sensing Intrinsic Amplitude: 12 mV
Lead Channel Sensing Intrinsic Amplitude: 5 mV
Lead Channel Setting Pacing Amplitude: 0.875
Lead Channel Setting Pacing Amplitude: 2 V
Lead Channel Setting Pacing Pulse Width: 0.4 ms
Lead Channel Setting Sensing Sensitivity: 2 mV
Pulse Gen Model: 2272
Pulse Gen Serial Number: 9064209

## 2019-06-10 ENCOUNTER — Encounter: Payer: Self-pay | Admitting: Cardiology

## 2019-06-10 NOTE — Progress Notes (Signed)
Remote pacemaker transmission.   

## 2019-08-15 NOTE — Progress Notes (Signed)
Patient Care Team: Danella PentonMiller, Mark F, MD as PCP - General (Internal Medicine)   HPI  Erin Browning is a 85 y.o. female Seen in follow-up for pacemaker implanted 9/19 (GT) having presented with complete heart block preceded by 2 weeks of weakness.  Date Cr K Ca  9/19 1.5 <<(0.9) 4.1   5/20  1.1 3.9 9.3    The patient denies chest pain, shortness of breath, nocturnal dyspnea, orthopnea ; she struggles with edema There have been no palpitations, lightheadedness or syncope.    Diet is replete of sodium  Also complaints of cramps, daytime and night  Records and Results Reviewed   Past Medical History:  Diagnosis Date  . Arthritis    ankles  . Blood clot in vein    left leg, several yrs ago  . CHB (complete heart block) (HCC)    dual chamber St Jude pacemaker insertion 08/27/18  . Wears dentures    full upper and lower  . Wears hearing aid    bilateral    Past Surgical History:  Procedure Laterality Date  . BROW LIFT Bilateral 10/30/2015   Procedure: BLEPHAROPLASTY;  Surgeon: Imagene RichesAmy M Fowler, MD;  [facility];  Service: Ophthalmology;  Laterality: Bilateral;  . CATARACT EXTRACTION W/PHACO Left 01/16/2016   Procedure: CATARACT EXTRACTION PHACO AND INTRAOCULAR LENS PLACEMENT (IOC);  Surgeon: Lockie Molahadwick Brasington, MD;  [facility];  Service: Ophthalmology;  Laterality: Left;  . CATARACT EXTRACTION W/PHACO Right 02/18/2016   Procedure: CATARACT EXTRACTION PHACO AND INTRAOCULAR LENS PLACEMENT (IOC);  Surgeon: Lockie Molahadwick Brasington, MD;  [facility];  Service: Ophthalmology;  Laterality: Right;  . CHOLECYSTECTOMY    . COLONOSCOPY    . ENTROPIAN REPAIR Bilateral 10/30/2015   Procedure: REPAIR OF ENTROPION SUTURES LEFT EYE  EXTENSIVE LEFT EYE;  Surgeon: Imagene RichesAmy M Fowler, MD;  [facility];  Service: Ophthalmology;  Laterality: Bilateral;  . PACEMAKER IMPLANT N/A 08/27/2018   Procedure: PACEMAKER IMPLANT;  Surgeon:  Duke SalviaKlein, Steven C, MD;  [facility];  Service: Cardiovascular;  Laterality: N/A;  . PTOSIS REPAIR Bilateral 10/30/2015   Procedure: PTOSIS REPAIR;  Surgeon: Imagene RichesAmy M Fowler, MD;  [facility];  Service: Ophthalmology;  Laterality: Bilateral;    Current Meds  Medication Sig  . triamterene-hydrochlorothiazide (MAXZIDE-25) 37.5-25 MG tablet Take 1 tablet by mouth daily.  . vitamin B-12 (CYANOCOBALAMIN) 1000 MCG tablet Take 1,000 mcg by mouth daily. AM    No Known Allergies    Review of Systems negative except from HPI and PMH  Physical Exam BP 122/76 (BP Location: Right Arm, Patient Position: Sitting, Cuff Size: Normal)   Pulse 67   Ht 5' (1.524 m)   Wt 139 lb (63 kg)   SpO2 98%   BMI 27.15 kg/m  Well developed and well nourished in no acute distress HENT normal Neck supple with JVP-flat Clear Device pocket well healed; without hematoma or erythema.  There is no tethering  Regular rate and rhythm, no  gallop 2/6 murmur Abd-soft with active BS No Clubbing cyanosis 1 edema Skin-warm and dry A & Oriented  Grossly normal sensory and motor function  ECG sinus @ 69 with P-synchronous/ AV  pacing Freq PVCsRBBB indeterminate axis   Assessment and  Plan  High-grade heart block->> complete   Pacemaker-Saint Jude  HFpEF-chronic  HTN   Cramps  Mildly volume overloaded-- diet is sodium loaded  We have discussed the physiology of heart failure including the  importance of salt restriction and fluid restriction and have reviewed sources of dietary salt and water. And given her problems with urinary urgency and "incontinence" would like to not increase diuresis so have encouraged her to decrease sodium intake-- consider nuSalt  Reviewed cramps--will give Rx for OTC Mg  We spent more than 50% of our >25 min visit in face to face counseling regarding the above

## 2019-08-16 ENCOUNTER — Encounter: Payer: Self-pay | Admitting: Internal Medicine

## 2019-08-16 ENCOUNTER — Other Ambulatory Visit: Payer: Self-pay

## 2019-08-16 ENCOUNTER — Ambulatory Visit (INDEPENDENT_AMBULATORY_CARE_PROVIDER_SITE_OTHER): Payer: PPO | Admitting: Internal Medicine

## 2019-08-16 VITALS — BP 122/76 | HR 67 | Ht 60.0 in | Wt 139.0 lb

## 2019-08-16 DIAGNOSIS — R001 Bradycardia, unspecified: Secondary | ICD-10-CM

## 2019-08-16 DIAGNOSIS — I442 Atrioventricular block, complete: Secondary | ICD-10-CM

## 2019-08-16 DIAGNOSIS — Z950 Presence of cardiac pacemaker: Secondary | ICD-10-CM

## 2019-08-16 MED ORDER — MAGNESIUM OXIDE 400 MG PO TABS: 400.0000 mg | ORAL_TABLET | Freq: Every day | ORAL | 3 refills | Status: DC

## 2019-08-16 NOTE — Patient Instructions (Signed)
Medication Instructions:  - Your physician has recommended you make the following change in your medication:   1) Start magnesium oxide 400 mg- take 1 tablet by mouth once daily  If you need a refill on your cardiac medications before your next appointment, please call your pharmacy.   Lab work: - none ordered  If you have labs (blood work) drawn today and your tests are completely normal, you will receive your results only by: Marland Kitchen MyChart Message (if you have MyChart) OR . A paper copy in the mail If you have any lab test that is abnormal or we need to change your treatment, we will call you to review the results.  Testing/Procedures: - none ordered  Follow-Up: At [facility], you and your health needs are our priority.  As part of our continuing mission to provide you with exceptional heart care, we have created designated Provider Care Teams.  These Care Teams include your primary Cardiologist (physician) and Advanced Practice Providers (APPs -  Physician Assistants and Nurse Practitioners) who all work together to provide you with the care you need, when you need it. . You will need a follow up appointment in 1 year (September 2021)  with Dr. Caryl Comes.   Marland Kitchen Please call our office 2 months in advance to schedule this appointment. (Call in early July 2021 to schedule)   Any Other Special Instructions Will Be Listed Below (If Applicable). - N/A

## 2019-08-30 ENCOUNTER — Ambulatory Visit (INDEPENDENT_AMBULATORY_CARE_PROVIDER_SITE_OTHER): Payer: PPO | Admitting: *Deleted

## 2019-08-30 DIAGNOSIS — I442 Atrioventricular block, complete: Secondary | ICD-10-CM | POA: Diagnosis not present

## 2019-08-30 LAB — CUP PACEART REMOTE DEVICE CHECK
Battery Remaining Longevity: 124 mo
Battery Remaining Percentage: 95.5 %
Battery Voltage: 3.01 V
Brady Statistic AP VP Percent: 1 %
Brady Statistic AP VS Percent: 1 %
Brady Statistic AS VP Percent: 99 %
Brady Statistic AS VS Percent: 1 %
Brady Statistic RA Percent Paced: 1 %
Brady Statistic RV Percent Paced: 99 %
Date Time Interrogation Session: 20200915060017
Implantable Lead Implant Date: 20190913
Implantable Lead Implant Date: 20190913
Implantable Lead Location: 753859
Implantable Lead Location: 753860
Implantable Lead Model: 1944
Implantable Lead Model: 1948
Implantable Pulse Generator Implant Date: 20190913
Lead Channel Impedance Value: 550 Ohm
Lead Channel Impedance Value: 800 Ohm
Lead Channel Pacing Threshold Amplitude: 0.5 V
Lead Channel Pacing Threshold Amplitude: 0.75 V
Lead Channel Pacing Threshold Pulse Width: 0.4 ms
Lead Channel Pacing Threshold Pulse Width: 0.4 ms
Lead Channel Sensing Intrinsic Amplitude: 12 mV
Lead Channel Sensing Intrinsic Amplitude: 5 mV
Lead Channel Setting Pacing Amplitude: 0.75 V
Lead Channel Setting Pacing Amplitude: 2 V
Lead Channel Setting Pacing Pulse Width: 0.4 ms
Lead Channel Setting Sensing Sensitivity: 2 mV
Pulse Gen Model: 2272
Pulse Gen Serial Number: 9064209

## 2019-09-06 NOTE — Progress Notes (Signed)
Remote pacemaker transmission.   

## 2019-09-20 LAB — CUP PACEART INCLINIC DEVICE CHECK
Battery Remaining Longevity: 126 mo
Battery Voltage: 3.02 V
Brady Statistic RA Percent Paced: 1.3 %
Brady Statistic RV Percent Paced: 99.41 %
Date Time Interrogation Session: 20200901132908
Implantable Lead Implant Date: 20190913
Implantable Lead Implant Date: 20190913
Implantable Lead Location: 753859
Implantable Lead Location: 753860
Implantable Lead Model: 1944
Implantable Lead Model: 1948
Implantable Pulse Generator Implant Date: 20190913
Lead Channel Impedance Value: 575 Ohm
Lead Channel Impedance Value: 850 Ohm
Lead Channel Pacing Threshold Amplitude: 0.5 V
Lead Channel Pacing Threshold Amplitude: 0.75 V
Lead Channel Pacing Threshold Pulse Width: 0.4 ms
Lead Channel Pacing Threshold Pulse Width: 0.4 ms
Lead Channel Sensing Intrinsic Amplitude: 5 mV
Lead Channel Setting Pacing Amplitude: 0.75 V
Lead Channel Setting Pacing Amplitude: 2 V
Lead Channel Setting Pacing Pulse Width: 0.4 ms
Lead Channel Setting Sensing Sensitivity: 2 mV
Pulse Gen Model: 2272
Pulse Gen Serial Number: 9064209

## 2019-11-01 DIAGNOSIS — E119 Type 2 diabetes mellitus without complications: Secondary | ICD-10-CM | POA: Diagnosis not present

## 2019-11-01 DIAGNOSIS — E538 Deficiency of other specified B group vitamins: Secondary | ICD-10-CM | POA: Diagnosis not present

## 2019-11-01 DIAGNOSIS — E039 Hypothyroidism, unspecified: Secondary | ICD-10-CM | POA: Diagnosis not present

## 2019-11-08 DIAGNOSIS — Z0000 Encounter for general adult medical examination without abnormal findings: Secondary | ICD-10-CM | POA: Diagnosis not present

## 2019-11-08 DIAGNOSIS — E538 Deficiency of other specified B group vitamins: Secondary | ICD-10-CM | POA: Diagnosis not present

## 2019-11-08 DIAGNOSIS — E119 Type 2 diabetes mellitus without complications: Secondary | ICD-10-CM | POA: Diagnosis not present

## 2019-11-29 ENCOUNTER — Ambulatory Visit (INDEPENDENT_AMBULATORY_CARE_PROVIDER_SITE_OTHER): Payer: PPO | Admitting: *Deleted

## 2019-11-29 DIAGNOSIS — I442 Atrioventricular block, complete: Secondary | ICD-10-CM

## 2019-11-29 LAB — CUP PACEART REMOTE DEVICE CHECK
Battery Remaining Longevity: 134 mo
Battery Remaining Percentage: 95.5 %
Battery Voltage: 3.01 V
Brady Statistic AP VP Percent: 1.1 %
Brady Statistic AP VS Percent: 1 %
Brady Statistic AS VP Percent: 99 %
Brady Statistic AS VS Percent: 1 %
Brady Statistic RA Percent Paced: 1 %
Brady Statistic RV Percent Paced: 99 %
Date Time Interrogation Session: 20201215115024
Implantable Lead Implant Date: 20190913
Implantable Lead Implant Date: 20190913
Implantable Lead Location: 753859
Implantable Lead Location: 753860
Implantable Lead Model: 1944
Implantable Lead Model: 1948
Implantable Pulse Generator Implant Date: 20190913
Lead Channel Impedance Value: 580 Ohm
Lead Channel Impedance Value: 840 Ohm
Lead Channel Pacing Threshold Amplitude: 0.5 V
Lead Channel Pacing Threshold Amplitude: 0.75 V
Lead Channel Pacing Threshold Pulse Width: 0.4 ms
Lead Channel Pacing Threshold Pulse Width: 0.4 ms
Lead Channel Sensing Intrinsic Amplitude: 12 mV
Lead Channel Sensing Intrinsic Amplitude: 5 mV
Lead Channel Setting Pacing Amplitude: 0.75 V
Lead Channel Setting Pacing Amplitude: 2 V
Lead Channel Setting Pacing Pulse Width: 0.4 ms
Lead Channel Setting Sensing Sensitivity: 2 mV
Pulse Gen Model: 2272
Pulse Gen Serial Number: 9064209

## 2019-12-31 NOTE — Progress Notes (Signed)
PPM remote 

## 2020-02-28 ENCOUNTER — Ambulatory Visit (INDEPENDENT_AMBULATORY_CARE_PROVIDER_SITE_OTHER): Payer: PPO | Admitting: *Deleted

## 2020-02-28 DIAGNOSIS — I442 Atrioventricular block, complete: Secondary | ICD-10-CM

## 2020-02-29 LAB — CUP PACEART REMOTE DEVICE CHECK
Battery Remaining Longevity: 134 mo
Battery Remaining Percentage: 95.5 %
Battery Voltage: 3.01 V
Brady Statistic AP VP Percent: 1.2 %
Brady Statistic AP VS Percent: 1 %
Brady Statistic AS VP Percent: 99 %
Brady Statistic AS VS Percent: 1 %
Brady Statistic RA Percent Paced: 1 %
Brady Statistic RV Percent Paced: 99 %
Date Time Interrogation Session: 20210316053252
Implantable Lead Implant Date: 20190913
Implantable Lead Implant Date: 20190913
Implantable Lead Location: 753859
Implantable Lead Location: 753860
Implantable Lead Model: 1944
Implantable Lead Model: 1948
Implantable Pulse Generator Implant Date: 20190913
Lead Channel Impedance Value: 510 Ohm
Lead Channel Impedance Value: 780 Ohm
Lead Channel Pacing Threshold Amplitude: 0.5 V
Lead Channel Pacing Threshold Amplitude: 0.75 V
Lead Channel Pacing Threshold Pulse Width: 0.4 ms
Lead Channel Pacing Threshold Pulse Width: 0.4 ms
Lead Channel Sensing Intrinsic Amplitude: 12 mV
Lead Channel Sensing Intrinsic Amplitude: 5 mV
Lead Channel Setting Pacing Amplitude: 0.75 V
Lead Channel Setting Pacing Amplitude: 2 V
Lead Channel Setting Pacing Pulse Width: 0.4 ms
Lead Channel Setting Sensing Sensitivity: 2 mV
Pulse Gen Model: 2272
Pulse Gen Serial Number: 9064209

## 2020-02-29 NOTE — Progress Notes (Signed)
PPM Remote  

## 2020-05-01 DIAGNOSIS — E538 Deficiency of other specified B group vitamins: Secondary | ICD-10-CM | POA: Diagnosis not present

## 2020-05-01 DIAGNOSIS — E119 Type 2 diabetes mellitus without complications: Secondary | ICD-10-CM | POA: Diagnosis not present

## 2020-05-08 DIAGNOSIS — Z0000 Encounter for general adult medical examination without abnormal findings: Secondary | ICD-10-CM | POA: Diagnosis not present

## 2020-05-08 DIAGNOSIS — E039 Hypothyroidism, unspecified: Secondary | ICD-10-CM | POA: Diagnosis not present

## 2020-05-08 DIAGNOSIS — E538 Deficiency of other specified B group vitamins: Secondary | ICD-10-CM | POA: Diagnosis not present

## 2020-05-08 DIAGNOSIS — D696 Thrombocytopenia, unspecified: Secondary | ICD-10-CM | POA: Diagnosis not present

## 2020-05-08 DIAGNOSIS — E119 Type 2 diabetes mellitus without complications: Secondary | ICD-10-CM | POA: Diagnosis not present

## 2020-05-29 ENCOUNTER — Ambulatory Visit (INDEPENDENT_AMBULATORY_CARE_PROVIDER_SITE_OTHER): Payer: PPO | Admitting: *Deleted

## 2020-05-29 DIAGNOSIS — I442 Atrioventricular block, complete: Secondary | ICD-10-CM | POA: Diagnosis not present

## 2020-05-30 LAB — CUP PACEART REMOTE DEVICE CHECK
Battery Remaining Longevity: 135 mo
Battery Remaining Percentage: 95.5 %
Battery Voltage: 3.01 V
Brady Statistic AP VP Percent: 1.5 %
Brady Statistic AP VS Percent: 1 %
Brady Statistic AS VP Percent: 98 %
Brady Statistic AS VS Percent: 1 %
Brady Statistic RA Percent Paced: 1 %
Brady Statistic RV Percent Paced: 99 %
Date Time Interrogation Session: 20210615020019
Implantable Lead Implant Date: 20190913
Implantable Lead Implant Date: 20190913
Implantable Lead Location: 753859
Implantable Lead Location: 753860
Implantable Lead Model: 1944
Implantable Lead Model: 1948
Implantable Pulse Generator Implant Date: 20190913
Lead Channel Impedance Value: 540 Ohm
Lead Channel Impedance Value: 810 Ohm
Lead Channel Pacing Threshold Amplitude: 0.5 V
Lead Channel Pacing Threshold Amplitude: 0.75 V
Lead Channel Pacing Threshold Pulse Width: 0.4 ms
Lead Channel Pacing Threshold Pulse Width: 0.4 ms
Lead Channel Sensing Intrinsic Amplitude: 12 mV
Lead Channel Sensing Intrinsic Amplitude: 5 mV
Lead Channel Setting Pacing Amplitude: 0.75 V
Lead Channel Setting Pacing Amplitude: 2 V
Lead Channel Setting Pacing Pulse Width: 0.4 ms
Lead Channel Setting Sensing Sensitivity: 2 mV
Pulse Gen Model: 2272
Pulse Gen Serial Number: 9064209

## 2020-05-30 NOTE — Progress Notes (Signed)
Remote pacemaker transmission.   

## 2020-07-16 IMAGING — DX DG CHEST 1V PORT
2 series · 2 of 2 positions shown · non-contrast
Comparison: Chest radiograph 08/17/2006

CLINICAL DATA: Patient with low heart rate.  Nausea.

EXAM:
PORTABLE CHEST 1 VIEW

[chest ap (1 of 2)]
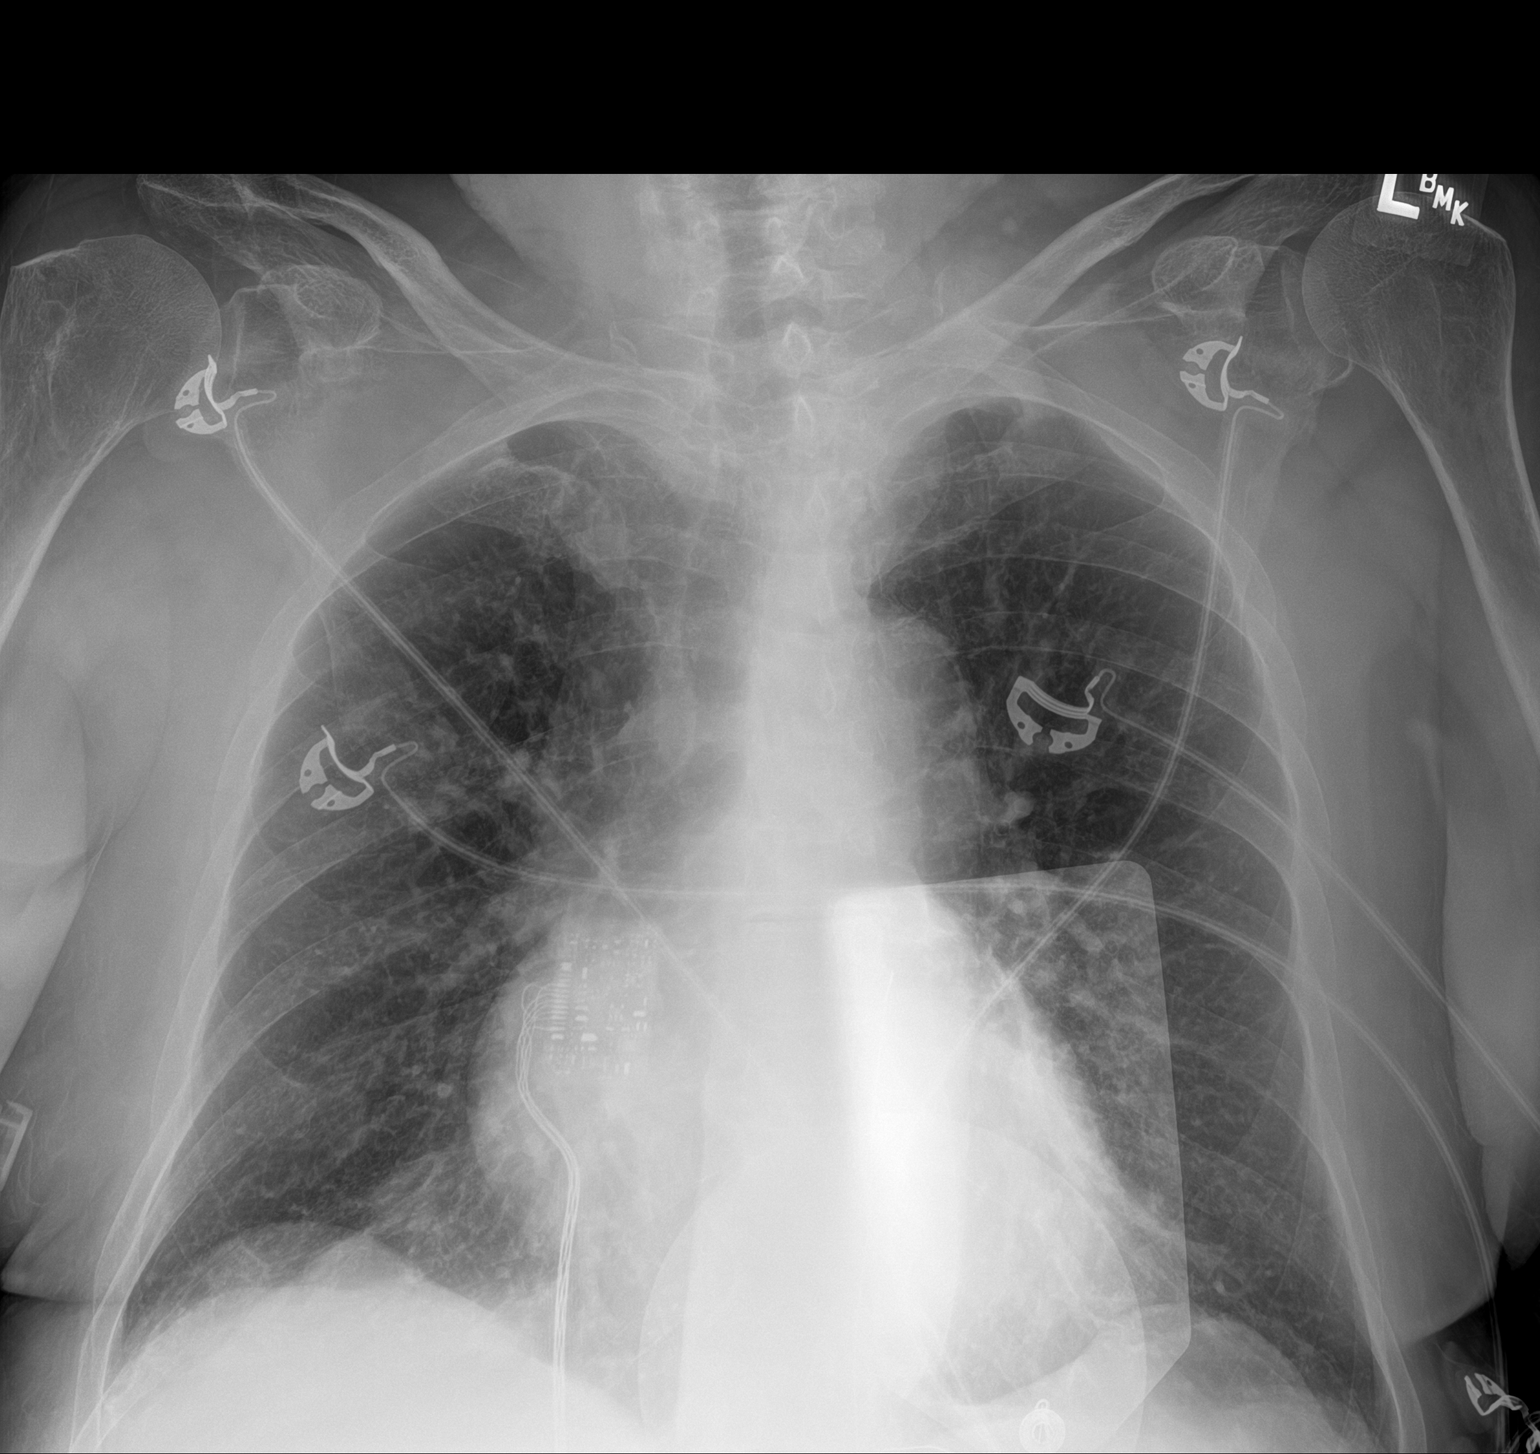

[chest ap (2 of 2)]
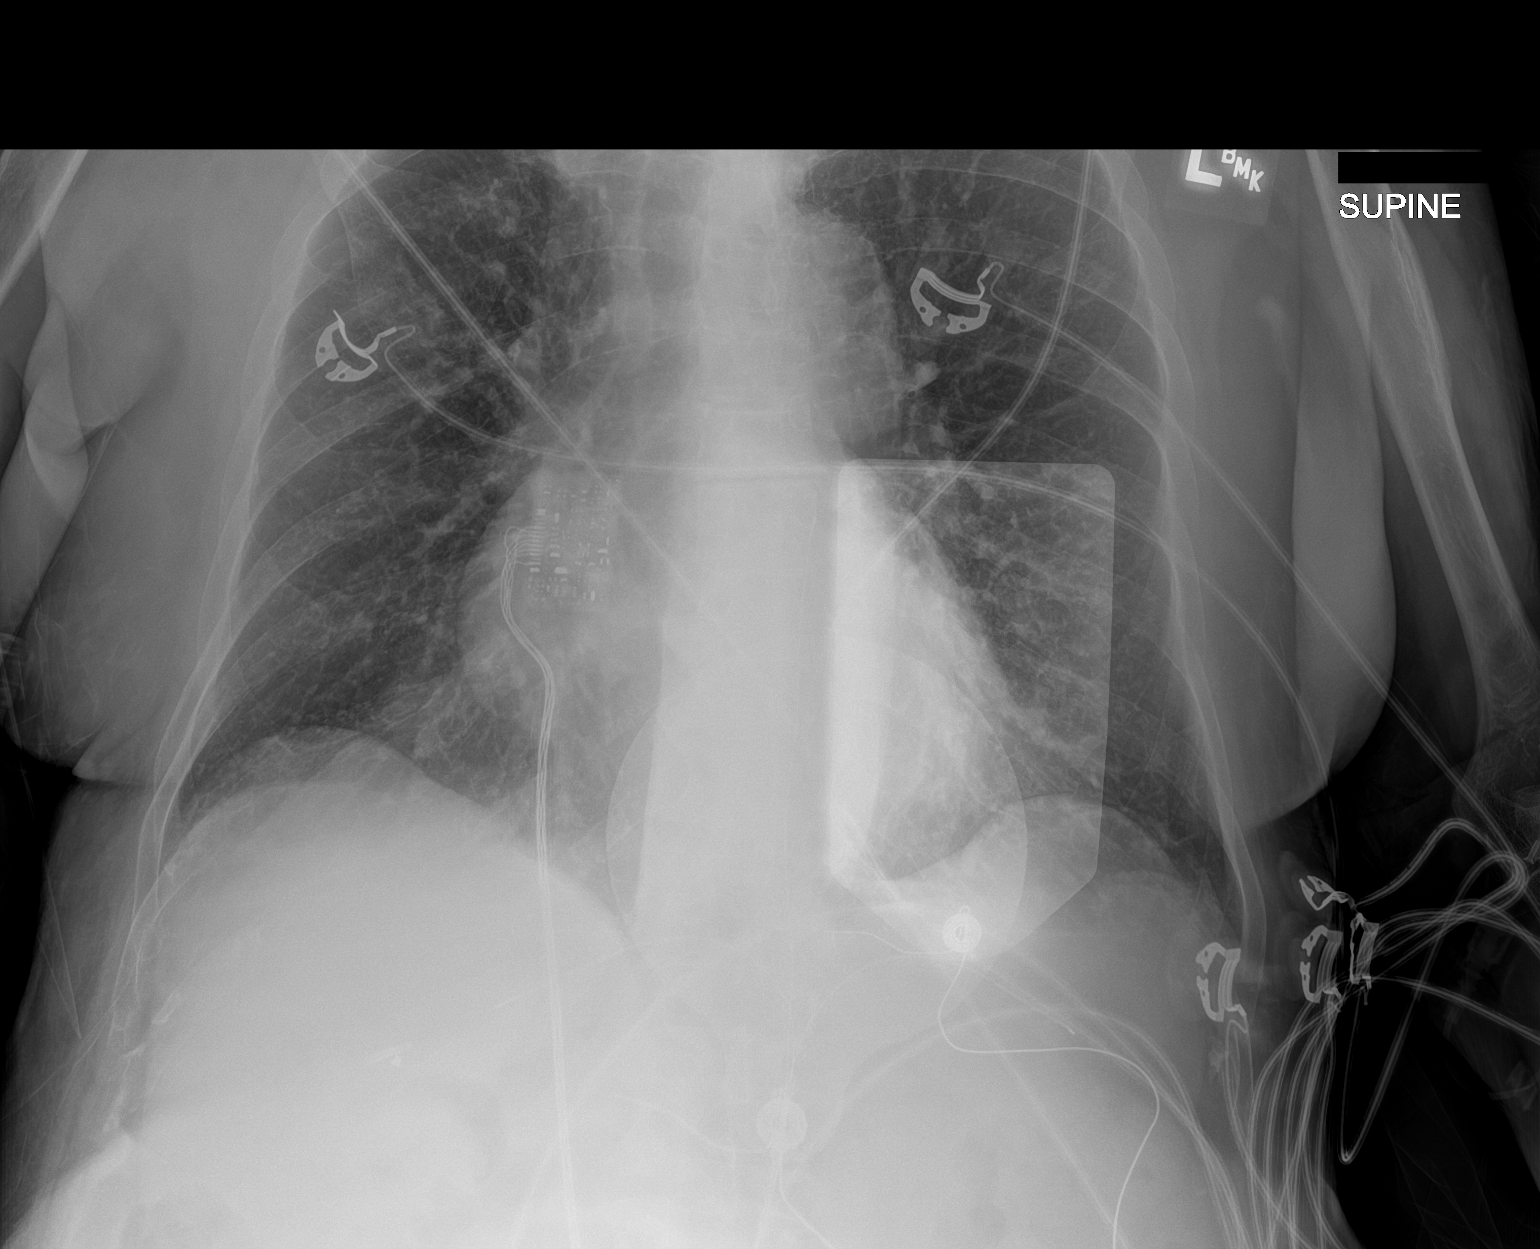

[2 of 2 positions shown; findings below may reference images not displayed]

FINDINGS: Monitoring leads overlie the patient. Pacer pad overlies the
patient. Cardiomegaly. Tortuosity of the thoracic aorta. Bibasilar
heterogeneous opacities. Thoracic spine degenerative changes.
IMPRESSION: Cardiomegaly.

Basilar heterogeneous opacities favored to represent atelectasis.
Infection not excluded.

## 2020-08-28 ENCOUNTER — Ambulatory Visit (INDEPENDENT_AMBULATORY_CARE_PROVIDER_SITE_OTHER): Payer: PPO | Admitting: Internal Medicine

## 2020-08-28 ENCOUNTER — Encounter: Payer: Self-pay | Admitting: Internal Medicine

## 2020-08-28 ENCOUNTER — Other Ambulatory Visit: Payer: Self-pay

## 2020-08-28 ENCOUNTER — Ambulatory Visit (INDEPENDENT_AMBULATORY_CARE_PROVIDER_SITE_OTHER): Payer: PPO | Admitting: *Deleted

## 2020-08-28 VITALS — BP 112/66 | HR 81 | Ht 61.0 in | Wt 143.0 lb

## 2020-08-28 DIAGNOSIS — I442 Atrioventricular block, complete: Secondary | ICD-10-CM

## 2020-08-28 DIAGNOSIS — I5032 Chronic diastolic (congestive) heart failure: Secondary | ICD-10-CM

## 2020-08-28 DIAGNOSIS — Z950 Presence of cardiac pacemaker: Secondary | ICD-10-CM

## 2020-08-28 LAB — CUP PACEART REMOTE DEVICE CHECK
Battery Remaining Longevity: 134 mo
Battery Remaining Percentage: 95.5 %
Battery Voltage: 3.01 V
Brady Statistic AP VP Percent: 1.4 %
Brady Statistic AP VS Percent: 1 %
Brady Statistic AS VP Percent: 98 %
Brady Statistic AS VS Percent: 1 %
Brady Statistic RA Percent Paced: 1 %
Brady Statistic RV Percent Paced: 99 %
Date Time Interrogation Session: 20210914025354
Implantable Lead Implant Date: 20190913
Implantable Lead Implant Date: 20190913
Implantable Lead Location: 753859
Implantable Lead Location: 753860
Implantable Lead Model: 1944
Implantable Lead Model: 1948
Implantable Pulse Generator Implant Date: 20190913
Lead Channel Impedance Value: 590 Ohm
Lead Channel Impedance Value: 840 Ohm
Lead Channel Pacing Threshold Amplitude: 0.625 V
Lead Channel Pacing Threshold Amplitude: 0.75 V
Lead Channel Pacing Threshold Pulse Width: 0.4 ms
Lead Channel Pacing Threshold Pulse Width: 0.4 ms
Lead Channel Sensing Intrinsic Amplitude: 12 mV
Lead Channel Sensing Intrinsic Amplitude: 5 mV
Lead Channel Setting Pacing Amplitude: 0.875
Lead Channel Setting Pacing Amplitude: 2 V
Lead Channel Setting Pacing Pulse Width: 0.4 ms
Lead Channel Setting Sensing Sensitivity: 2 mV
Pulse Gen Model: 2272
Pulse Gen Serial Number: 9064209

## 2020-08-28 LAB — PACEMAKER DEVICE OBSERVATION

## 2020-08-28 NOTE — Progress Notes (Signed)
Patient Care Team: Danella Penton, MD as PCP - General (Internal Medicine)   HPI  Erin Browning is a 86 y.o. female Seen in follow-up for pacemaker-St Jude  implanted 9/19 (GT) having presented with complete heart block preceded by 2 weeks of weakness.  Date Cr K Hgb  9/19 1.5 <<(0.9) 4.1   5/20  1.1 3.9   5/21 1.1 4.1 14.7    The patient denies chest pain, shortness of breath, nocturnal dyspnea, orthopnea  There have been no palpitations, lightheadedness or syncope.  some edema  Biggest issue is balance and hearing    Records and Results Reviewed   Past Medical History:  Diagnosis Date  . Arthritis    ankles  . Blood clot in vein    left leg, several yrs ago  . CHB (complete heart block) (HCC)    dual chamber St Jude pacemaker insertion 08/27/18  . Wears dentures    full upper and lower  . Wears hearing aid    bilateral    Past Surgical History:  Procedure Laterality Date  . BROW LIFT Bilateral 10/30/2015   Procedure: BLEPHAROPLASTY;  Surgeon: Imagene Riches, MD;  Location: [facility];  Service: Ophthalmology;  Laterality: Bilateral;  . CATARACT EXTRACTION W/PHACO Left 01/16/2016   Procedure: CATARACT EXTRACTION PHACO AND INTRAOCULAR LENS PLACEMENT (IOC);  Surgeon: Lockie Mola, MD;  Location: [facility];  Service: Ophthalmology;  Laterality: Left;  . CATARACT EXTRACTION W/PHACO Right 02/18/2016   Procedure: CATARACT EXTRACTION PHACO AND INTRAOCULAR LENS PLACEMENT (IOC);  Surgeon: Lockie Mola, MD;  Location: [facility];  Service: Ophthalmology;  Laterality: Right;  . CHOLECYSTECTOMY    . COLONOSCOPY    . ENTROPIAN REPAIR Bilateral 10/30/2015   Procedure: REPAIR OF ENTROPION SUTURES LEFT EYE  EXTENSIVE LEFT EYE;  Surgeon: Imagene Riches, MD;  Location: [facility];  Service: Ophthalmology;  Laterality: Bilateral;  . PACEMAKER IMPLANT N/A 08/27/2018   Procedure: PACEMAKER IMPLANT;  Surgeon: Duke Salvia, MD;   Location: [facility] INVAS[facility] CV LAB;  Service: Cardiovascular;  Laterality: N/A;  . PTOSIS REPAIR Bilateral 10/30/2015   Procedure: PTOSIS REPAIR;  Surgeon: Imagene Riches, MD;  Location: [facility];  Service: Ophthalmology;  Laterality: Bilateral;    Current Meds  Medication Sig  . magnesium oxide (MAG-OX) 400 MG tablet Take 1 tablet (400 mg total) by mouth daily.  Marland Kitchen triamterene-hydrochlorothiazide (MAXZIDE-25) 37.5-25 MG tablet Take 1 tablet by mouth daily.  . vitamin B-12 (CYANOCOBALAMIN) 1000 MCG tablet Take 1,000 mcg by mouth daily. AM    No Known Allergies    Review of Systems negative except from HPI and PMH  Physical Exam BP 112/66 (BP Location: Right Arm, Patient Position: Sitting, Cuff Size: Normal)   Pulse 81   Ht 5\' 1"  (1.549 m)   Wt 143 lb (64.9 kg)   BMI 27.02 kg/m  Well developed and well nourished in no acute distress HENT normal Neck supple with JVP-flat Clear Device pocket well healed; without hematoma or erythema.  There is no tethering  Regular rate and rhythm, no murmur Abd-soft with active BS No Clubbing cyanosis tr edema Skin-warm and dry A & Oriented  Grossly normal sensory and motor function  ECG P-synchronous/ AV  pacing    Assessment and  Plan  High-grade heart block->> complete   Pacemaker-Saint Jude  HFpEF-chronic  HTN     Mostly euvolemic  I have reached out to Dr MM regarding double diuretics BP well  controlled

## 2020-08-28 NOTE — Patient Instructions (Signed)

## 2020-08-30 NOTE — Progress Notes (Signed)
Remote pacemaker transmission.   

## 2020-11-06 DIAGNOSIS — E538 Deficiency of other specified B group vitamins: Secondary | ICD-10-CM | POA: Diagnosis not present

## 2020-11-06 DIAGNOSIS — E119 Type 2 diabetes mellitus without complications: Secondary | ICD-10-CM | POA: Diagnosis not present

## 2020-11-13 DIAGNOSIS — E119 Type 2 diabetes mellitus without complications: Secondary | ICD-10-CM | POA: Diagnosis not present

## 2020-11-13 DIAGNOSIS — D696 Thrombocytopenia, unspecified: Secondary | ICD-10-CM | POA: Diagnosis not present

## 2020-11-13 DIAGNOSIS — E538 Deficiency of other specified B group vitamins: Secondary | ICD-10-CM | POA: Diagnosis not present

## 2020-11-13 DIAGNOSIS — Z0000 Encounter for general adult medical examination without abnormal findings: Secondary | ICD-10-CM | POA: Diagnosis not present

## 2020-11-27 ENCOUNTER — Ambulatory Visit (INDEPENDENT_AMBULATORY_CARE_PROVIDER_SITE_OTHER): Payer: PPO

## 2020-11-27 DIAGNOSIS — I442 Atrioventricular block, complete: Secondary | ICD-10-CM | POA: Diagnosis not present

## 2020-11-28 LAB — CUP PACEART REMOTE DEVICE CHECK
Battery Remaining Longevity: 134 mo
Battery Remaining Percentage: 95.5 %
Battery Voltage: 3.01 V
Brady Statistic AP VP Percent: 1.5 %
Brady Statistic AP VS Percent: 1 %
Brady Statistic AS VP Percent: 98 %
Brady Statistic AS VS Percent: 1 %
Brady Statistic RA Percent Paced: 1.3 %
Brady Statistic RV Percent Paced: 99 %
Date Time Interrogation Session: 20211215015017
Implantable Lead Implant Date: 20190913
Implantable Lead Implant Date: 20190913
Implantable Lead Location: 753859
Implantable Lead Location: 753860
Implantable Lead Model: 1944
Implantable Lead Model: 1948
Implantable Pulse Generator Implant Date: 20190913
Lead Channel Impedance Value: 590 Ohm
Lead Channel Impedance Value: 760 Ohm
Lead Channel Pacing Threshold Amplitude: 0.5 V
Lead Channel Pacing Threshold Amplitude: 0.5 V
Lead Channel Pacing Threshold Pulse Width: 0.4 ms
Lead Channel Pacing Threshold Pulse Width: 0.4 ms
Lead Channel Sensing Intrinsic Amplitude: 12 mV
Lead Channel Sensing Intrinsic Amplitude: 5 mV
Lead Channel Setting Pacing Amplitude: 0.75 V
Lead Channel Setting Pacing Amplitude: 2 V
Lead Channel Setting Pacing Pulse Width: 0.4 ms
Lead Channel Setting Sensing Sensitivity: 4 mV
Pulse Gen Model: 2272
Pulse Gen Serial Number: 9064209

## 2020-12-12 NOTE — Progress Notes (Signed)
Remote pacemaker transmission.   

## 2021-02-26 ENCOUNTER — Ambulatory Visit (INDEPENDENT_AMBULATORY_CARE_PROVIDER_SITE_OTHER): Payer: PPO

## 2021-02-26 DIAGNOSIS — I442 Atrioventricular block, complete: Secondary | ICD-10-CM

## 2021-02-26 LAB — CUP PACEART REMOTE DEVICE CHECK
Battery Remaining Longevity: 132 mo
Battery Remaining Percentage: 95.5 %
Battery Voltage: 3.01 V
Brady Statistic AP VP Percent: 1.5 %
Brady Statistic AP VS Percent: 1 %
Brady Statistic AS VP Percent: 98 %
Brady Statistic AS VS Percent: 1 %
Brady Statistic RA Percent Paced: 1.3 %
Brady Statistic RV Percent Paced: 99 %
Date Time Interrogation Session: 20220315102251
Implantable Lead Implant Date: 20190913
Implantable Lead Implant Date: 20190913
Implantable Lead Location: 753859
Implantable Lead Location: 753860
Implantable Lead Model: 1944
Implantable Lead Model: 1948
Implantable Pulse Generator Implant Date: 20190913
Lead Channel Impedance Value: 430 Ohm
Lead Channel Impedance Value: 700 Ohm
Lead Channel Pacing Threshold Amplitude: 0.5 V
Lead Channel Pacing Threshold Amplitude: 0.625 V
Lead Channel Pacing Threshold Pulse Width: 0.4 ms
Lead Channel Pacing Threshold Pulse Width: 0.4 ms
Lead Channel Sensing Intrinsic Amplitude: 12 mV
Lead Channel Sensing Intrinsic Amplitude: 5 mV
Lead Channel Setting Pacing Amplitude: 0.875
Lead Channel Setting Pacing Amplitude: 2 V
Lead Channel Setting Pacing Pulse Width: 0.4 ms
Lead Channel Setting Sensing Sensitivity: 4 mV
Pulse Gen Model: 2272
Pulse Gen Serial Number: 9064209

## 2021-03-06 NOTE — Progress Notes (Signed)
Remote pacemaker transmission.   

## 2021-05-08 DIAGNOSIS — E538 Deficiency of other specified B group vitamins: Secondary | ICD-10-CM | POA: Diagnosis not present

## 2021-05-08 DIAGNOSIS — E119 Type 2 diabetes mellitus without complications: Secondary | ICD-10-CM | POA: Diagnosis not present

## 2021-05-15 DIAGNOSIS — E538 Deficiency of other specified B group vitamins: Secondary | ICD-10-CM | POA: Diagnosis not present

## 2021-05-15 DIAGNOSIS — D696 Thrombocytopenia, unspecified: Secondary | ICD-10-CM | POA: Diagnosis not present

## 2021-05-15 DIAGNOSIS — Z0000 Encounter for general adult medical examination without abnormal findings: Secondary | ICD-10-CM | POA: Diagnosis not present

## 2021-05-15 DIAGNOSIS — I442 Atrioventricular block, complete: Secondary | ICD-10-CM | POA: Diagnosis not present

## 2021-05-15 DIAGNOSIS — E119 Type 2 diabetes mellitus without complications: Secondary | ICD-10-CM | POA: Diagnosis not present

## 2021-05-28 ENCOUNTER — Ambulatory Visit (INDEPENDENT_AMBULATORY_CARE_PROVIDER_SITE_OTHER): Payer: PPO

## 2021-05-28 DIAGNOSIS — I442 Atrioventricular block, complete: Secondary | ICD-10-CM

## 2021-05-28 LAB — CUP PACEART REMOTE DEVICE CHECK
Battery Remaining Longevity: 134 mo
Battery Remaining Percentage: 95.5 %
Battery Voltage: 3.01 V
Brady Statistic AP VP Percent: 1.7 %
Brady Statistic AP VS Percent: 1 %
Brady Statistic AS VP Percent: 98 %
Brady Statistic AS VS Percent: 1 %
Brady Statistic RA Percent Paced: 1.5 %
Brady Statistic RV Percent Paced: 99 %
Date Time Interrogation Session: 20220614030417
Implantable Lead Implant Date: 20190913
Implantable Lead Implant Date: 20190913
Implantable Lead Location: 753859
Implantable Lead Location: 753860
Implantable Lead Model: 1944
Implantable Lead Model: 1948
Implantable Pulse Generator Implant Date: 20190913
Lead Channel Impedance Value: 580 Ohm
Lead Channel Impedance Value: 750 Ohm
Lead Channel Pacing Threshold Amplitude: 0.5 V
Lead Channel Pacing Threshold Amplitude: 0.5 V
Lead Channel Pacing Threshold Pulse Width: 0.4 ms
Lead Channel Pacing Threshold Pulse Width: 0.4 ms
Lead Channel Sensing Intrinsic Amplitude: 12 mV
Lead Channel Sensing Intrinsic Amplitude: 5 mV
Lead Channel Setting Pacing Amplitude: 0.75 V
Lead Channel Setting Pacing Amplitude: 2 V
Lead Channel Setting Pacing Pulse Width: 0.4 ms
Lead Channel Setting Sensing Sensitivity: 4 mV
Pulse Gen Model: 2272
Pulse Gen Serial Number: 9064209

## 2021-06-19 NOTE — Progress Notes (Signed)
Remote pacemaker transmission.   

## 2021-08-20 ENCOUNTER — Other Ambulatory Visit: Payer: Self-pay

## 2021-08-20 ENCOUNTER — Ambulatory Visit (INDEPENDENT_AMBULATORY_CARE_PROVIDER_SITE_OTHER): Payer: PPO | Admitting: Internal Medicine

## 2021-08-20 ENCOUNTER — Encounter: Payer: Self-pay | Admitting: Internal Medicine

## 2021-08-20 VITALS — BP 120/80 | HR 74 | Ht 61.0 in | Wt 141.0 lb

## 2021-08-20 DIAGNOSIS — I442 Atrioventricular block, complete: Secondary | ICD-10-CM

## 2021-08-20 DIAGNOSIS — Z950 Presence of cardiac pacemaker: Secondary | ICD-10-CM

## 2021-08-20 DIAGNOSIS — I5032 Chronic diastolic (congestive) heart failure: Secondary | ICD-10-CM

## 2021-08-20 NOTE — Progress Notes (Signed)
Patient Care Team: Danella Penton, MD as PCP - General (Internal Medicine)   HPI  Erin Browning is a 87 y.o. female Seen in follow-up for pacemaker-St Jude  implanted 9/19 (GT) having presented with complete heart block preceded by 2 weeks of weakness.  Date Cr K Hgb  9/19 1.5 <<(0.9) 4.1   5/20  1.1 3.9   5/21 1.1 4.1 14.7  5/22 0.9 3.9 14.4  The patient denies chest pain, shortness of breath, nocturnal dyspnea, orthopnea; some  peripheral edema which does not seem to respond to diuretics;  volume intake is modest.  There have been no palpitations, lightheadedness or syncope. Uses a walker with fear of falling   lives by herself. Daughter brings her meals daily   Records and Results Reviewed   Past Medical History:  Diagnosis Date   Arthritis    ankles   Blood clot in vein    left leg, several yrs ago   CHB (complete heart block) (HCC)    dual chamber St Jude pacemaker insertion 08/27/18   Wears dentures    full upper and lower   Wears hearing aid    bilateral    Past Surgical History:  Procedure Laterality Date   BROW LIFT Bilateral 10/30/2015   Procedure: BLEPHAROPLASTY;  Surgeon: Imagene Riches, MD;  Location: [facility];  Service: Ophthalmology;  Laterality: Bilateral;   CATARACT EXTRACTION W/PHACO Left 01/16/2016   Procedure: CATARACT EXTRACTION PHACO AND INTRAOCULAR LENS PLACEMENT (IOC);  Surgeon: Lockie Mola, MD;  Location: [facility];  Service: Ophthalmology;  Laterality: Left;   CATARACT EXTRACTION W/PHACO Right 02/18/2016   Procedure: CATARACT EXTRACTION PHACO AND INTRAOCULAR LENS PLACEMENT (IOC);  Surgeon: Lockie Mola, MD;  Location: [facility];  Service: Ophthalmology;  Laterality: Right;   CHOLECYSTECTOMY     COLONOSCOPY     ENTROPIAN REPAIR Bilateral 10/30/2015   Procedure: REPAIR OF ENTROPION SUTURES LEFT EYE  EXTENSIVE LEFT EYE;  Surgeon: Imagene Riches, MD;  Location: [facility];  Service:  Ophthalmology;  Laterality: Bilateral;   PACEMAKER IMPLANT N/A 08/27/2018   Procedure: PACEMAKER IMPLANT;  Surgeon: Duke Salvia, MD;  Location: [facility] INVASIVE CV LAB;  Service: Cardiovascular;  Laterality: N/A;   PTOSIS REPAIR Bilateral 10/30/2015   Procedure: PTOSIS REPAIR;  Surgeon: Imagene Riches, MD;  Location: [facility];  Service: Ophthalmology;  Laterality: Bilateral;    Current Meds  Medication Sig   cyanocobalamin (,VITAMIN B-12,) 1000 MCG/ML injection Inject 1,000 mcg into the muscle every 30 (thirty) days.   furosemide (LASIX) 20 MG tablet Take 20 mg by mouth daily as needed.   MAGNESIUM OXIDE 400 PO Take 1 tablet by mouth every other day.   triamterene-hydrochlorothiazide (MAXZIDE-25) 37.5-25 MG tablet Take 1 tablet by mouth daily.    No Known Allergies    Review of Systems negative except from HPI and PMH  Physical Exam BP 120/80 (BP Location: Left Arm, Patient Position: Sitting, Cuff Size: Normal)   Pulse 74   Ht 5\' 1"  (1.549 m)   Wt 141 lb (64 kg)   SpO2 95%   BMI 26.64 kg/m  Well developed and well nourished in no acute distress HENT normal Neck supple with JVP-flat Clear Device pocket well healed; without hematoma or erythema.  There is no tethering  Regular rate and rhythm, no murmur Abd-soft with active BS No Clubbing cyanosis 1+ edema Skin-warm and dry A & Oriented  Grossly normal sensory and motor function  ECG sinus with P-synchronous/ AV  pacing    Assessment and  Plan  High-grade heart block->> complete   Pacemaker-Saint Jude  HFpEF-chronic  HTN   Mild volume overload; diuretics per MM  BP well controlled, continue maxzide  Discussed the use of a "sit-upon" walker  as she is by herself  Device function normal

## 2021-08-20 NOTE — Patient Instructions (Signed)
Medication Instructions:  Your physician recommends that you continue on your current medications as directed. Please refer to the Current Medication list given to you today.  *If you need a refill on your cardiac medications before your next appointment, please call your pharmacy*   Lab Work: None Ordered  If you have labs (blood work) drawn today and your tests are completely normal, you will receive your results only by: MyChart Message (if you have MyChart) OR A paper copy in the mail If you have any lab test that is abnormal or we need to change your treatment, we will call you to review the results.   Testing/Procedures: None Ordered   Follow-Up: At [facility], you and your health needs are our priority.  As part of our continuing mission to provide you with exceptional heart care, we have created designated Provider Care Teams.  These Care Teams include your primary Cardiologist (physician) and Advanced Practice Providers (APPs -  Physician Assistants and Nurse Practitioners) who all work together to provide you with the care you need, when you need it.  We recommend signing up for the patient portal called "MyChart".  Sign up information is provided on this After Visit Summary.  MyChart is used to connect with patients for Virtual Visits (Telemedicine).  Patients are able to view lab/test results, encounter notes, upcoming appointments, etc.  Non-urgent messages can be sent to your provider as well.   To learn more about what you can do with MyChart, go to [email or web address].    Your next appointment:   1 year(s)  The format for your next appointment:   In Person  Provider:   Steven Klein, MD   Other Instructions   

## 2021-08-27 ENCOUNTER — Ambulatory Visit (INDEPENDENT_AMBULATORY_CARE_PROVIDER_SITE_OTHER): Payer: PPO

## 2021-08-27 DIAGNOSIS — I442 Atrioventricular block, complete: Secondary | ICD-10-CM

## 2021-08-28 LAB — CUP PACEART REMOTE DEVICE CHECK
Battery Remaining Longevity: 95 mo
Battery Remaining Percentage: 73 %
Battery Voltage: 3.01 V
Brady Statistic AP VP Percent: 6.2 %
Brady Statistic AP VS Percent: 1 %
Brady Statistic AS VP Percent: 92 %
Brady Statistic AS VS Percent: 1 %
Brady Statistic RA Percent Paced: 3.4 %
Brady Statistic RV Percent Paced: 98 %
Date Time Interrogation Session: 20220913094609
Implantable Lead Implant Date: 20190913
Implantable Lead Implant Date: 20190913
Implantable Lead Location: 753859
Implantable Lead Location: 753860
Implantable Lead Model: 1944
Implantable Lead Model: 1948
Implantable Pulse Generator Implant Date: 20190913
Lead Channel Impedance Value: 410 Ohm
Lead Channel Impedance Value: 750 Ohm
Lead Channel Pacing Threshold Amplitude: 0.5 V
Lead Channel Pacing Threshold Amplitude: 0.5 V
Lead Channel Pacing Threshold Pulse Width: 0.4 ms
Lead Channel Pacing Threshold Pulse Width: 0.4 ms
Lead Channel Sensing Intrinsic Amplitude: 12 mV
Lead Channel Sensing Intrinsic Amplitude: 5 mV
Lead Channel Setting Pacing Amplitude: 0.75 V
Lead Channel Setting Pacing Amplitude: 2 V
Lead Channel Setting Pacing Pulse Width: 0.4 ms
Lead Channel Setting Sensing Sensitivity: 4 mV
Pulse Gen Model: 2272
Pulse Gen Serial Number: 9064209

## 2021-09-04 NOTE — Progress Notes (Signed)
Remote pacemaker transmission.   

## 2021-10-11 ENCOUNTER — Ambulatory Visit
Admission: EM | Admit: 2021-10-11 | Discharge: 2021-10-11 | Disposition: A | Discharge status: Home / Self Care | Payer: PPO | Attending: Physician Assistant | Admitting: Physician Assistant

## 2021-10-11 ENCOUNTER — Encounter: Payer: Self-pay | Admitting: Licensed Clinical Social Worker

## 2021-10-11 ENCOUNTER — Other Ambulatory Visit: Payer: Self-pay

## 2021-10-11 DIAGNOSIS — L03116 Cellulitis of left lower limb: Secondary | ICD-10-CM | POA: Diagnosis not present

## 2021-10-11 DIAGNOSIS — S81802A Unspecified open wound, left lower leg, initial encounter: Secondary | ICD-10-CM

## 2021-10-11 MED ORDER — DOXYCYCLINE HYCLATE 100 MG PO CAPS: 100.0000 mg | ORAL_CAPSULE | Freq: Two times a day (BID) | ORAL | 0 refills | Status: AC

## 2021-10-11 MED ORDER — CEPHALEXIN 500 MG PO CAPS: 500.0000 mg | ORAL_CAPSULE | Freq: Four times a day (QID) | ORAL | 0 refills | Status: AC

## 2021-10-11 MED ORDER — MUPIROCIN 2 % EX OINT: 1.0000 "application " | TOPICAL_OINTMENT | Freq: Two times a day (BID) | CUTANEOUS | 0 refills | Status: DC

## 2021-10-11 NOTE — Discharge Instructions (Signed)
-  The wound is infected.  I have sent 2 different antibiotics.  Make sure to clean the wound every day with soap and water and then apply the ointment and cover with a nonadherent bandage. -Continue taking your Lasix and elevate your feet to help with swelling. -Make a follow-up appoint with your PCP for recheck -You need to be seen again sooner either here in the ED if the infection worsens.  For any severe acute worsening especially if the swelling and redness go further up the leg toward the knee or you have a fever, please go to emergency department.

## 2021-10-11 NOTE — ED Triage Notes (Signed)
Pt c/o wound on left leg, x 1 week.Denies pain.  Redness, swelling

## 2021-10-11 NOTE — ED Provider Notes (Signed)
[facility]    CSN: [id number] Arrival date & time: 10/11/21  1416      History   Chief Complaint Chief Complaint  Patient presents with   Leg Injury    HPI Erin Browning is a 87 y.o. female presenting for approximately 1 week history of wound to the left anterior lower leg.  Patient says she thinks she scraped it on something but she is not sure.  She noticed redness and swelling increasing in this area a couple days ago.  Patient has history of peripheral edema and takes Lasix but has not taken any recently.  Patient denies any fevers.  No numbness.  She says she has been trying to keep it clean with hydrogen peroxide.  There has been some light yellowish drainage from the area.  No history of recurrent skin infections or MRSA in history.  Patient does have history of complete heart block and pacemaker.  She has also had history of DVT.  She denies any calf pain.  No fevers.  No other complaints.  HPI  Past Medical History:  Diagnosis Date   Arthritis    ankles   Blood clot in vein    left leg, several yrs ago   CHB (complete heart block) (HCC)    dual chamber St Jude pacemaker insertion 08/27/18   Wears dentures    full upper and lower   Wears hearing aid    bilateral    Patient Active Problem List   Diagnosis Date Noted   Complete heart block (HCC) 08/27/2018   Symptomatic bradycardia 08/26/2018    Past Surgical History:  Procedure Laterality Date   BROW LIFT Bilateral 10/30/2015   Procedure: BLEPHAROPLASTY;  Surgeon: Imagene Riches, MD;  Location: [facility];  Service: Ophthalmology;  Laterality: Bilateral;   CATARACT EXTRACTION W/PHACO Left 01/16/2016   Procedure: CATARACT EXTRACTION PHACO AND INTRAOCULAR LENS PLACEMENT (IOC);  Surgeon: Lockie Mola, MD;  Location: [facility];  Service: Ophthalmology;  Laterality: Left;   CATARACT EXTRACTION W/PHACO Right 02/18/2016   Procedure: CATARACT EXTRACTION PHACO AND INTRAOCULAR LENS  PLACEMENT (IOC);  Surgeon: Lockie Mola, MD;  Location: [facility];  Service: Ophthalmology;  Laterality: Right;   CHOLECYSTECTOMY     COLONOSCOPY     ENTROPIAN REPAIR Bilateral 10/30/2015   Procedure: REPAIR OF ENTROPION SUTURES LEFT EYE  EXTENSIVE LEFT EYE;  Surgeon: Imagene Riches, MD;  Location: [facility];  Service: Ophthalmology;  Laterality: Bilateral;   PACEMAKER IMPLANT N/A 08/27/2018   Procedure: PACEMAKER IMPLANT;  Surgeon: Duke Salvia, MD;  Location: [facility] INVASIVE CV LAB;  Service: Cardiovascular;  Laterality: N/A;   PTOSIS REPAIR Bilateral 10/30/2015   Procedure: PTOSIS REPAIR;  Surgeon: Imagene Riches, MD;  Location: [facility];  Service: Ophthalmology;  Laterality: Bilateral;    OB History   No obstetric history on file.      Home Medications    Prior to Admission medications   Medication Sig Start Date End Date Taking? Authorizing Provider  cephALEXin (KEFLEX) 500 MG capsule Take 1 capsule (500 mg total) by mouth 4 (four) times daily for 7 days. 10/11/21 10/18/21 Yes Eusebio Friendly B, PA-C  cyanocobalamin (,VITAMIN B-12,) 1000 MCG/ML injection Inject 1,000 mcg into the muscle every 30 (thirty) days.   Yes [provider]  doxycycline (VIBRAMYCIN) 100 MG capsule Take 1 capsule (100 mg total) by mouth 2 (two) times daily for 7 days. 10/11/21 10/18/21 Yes Eusebio Friendly B, PA-C  mupirocin ointment (  BACTROBAN) 2 % Apply 1 application topically 2 (two) times daily. 10/11/21  Yes Eusebio Friendly B, PA-C  furosemide (LASIX) 20 MG tablet Take 20 mg by mouth daily as needed.    [provider]  MAGNESIUM OXIDE 400 PO Take 1 tablet by mouth every other day.    [provider]  triamterene-hydrochlorothiazide (MAXZIDE-25) 37.5-25 MG tablet Take 1 tablet by mouth daily. 09/10/17   [provider]    Family History Family History  Problem Relation Age of Onset   Heart attack Mother     Social History Social History    Tobacco Use   Smoking status: Former    Types: Cigarettes   Smokeless tobacco: Never   Tobacco comments:    quit 50+ yrs ago  Vaping Use   Vaping Use: Never used  Substance Use Topics   Alcohol use: No     Allergies   Patient has no known allergies.   Review of Systems Review of Systems  Constitutional:  Negative for fatigue and fever.  Cardiovascular:  Positive for leg swelling.  Musculoskeletal:  Negative for arthralgias and joint swelling.  Skin:  Positive for color change and wound.  Neurological:  Negative for weakness and numbness.    Physical Exam Triage Vital Signs ED Triage Vitals  Enc Vitals Group     BP 10/11/21 1509 136/72     Pulse Rate 10/11/21 1509 94     Resp 10/11/21 1509 16     Temp 10/11/21 1509 98.9 F (37.2 C)     Temp Source 10/11/21 1509 Oral     SpO2 10/11/21 1509 94 %     Weight --      Height --      Head Circumference --      Peak Flow --      Pain Score 10/11/21 1511 0     Pain Loc --      Pain Edu? --      Excl. in GC? --    No data found.  Updated Vital Signs BP 136/72 (BP Location: Left Arm)   Pulse 94   Temp 98.9 F (37.2 C) (Oral)   Resp 16   SpO2 94%      Physical Exam Vitals and nursing note reviewed.  Constitutional:      General: She is not in acute distress.    Appearance: Normal appearance. She is not ill-appearing or toxic-appearing.  HENT:     Head: Normocephalic and atraumatic.  Eyes:     General: No scleral icterus.       Right eye: No discharge.        Left eye: No discharge.     Conjunctiva/sclera: Conjunctivae normal.  Cardiovascular:     Rate and Rhythm: Normal rate and regular rhythm.     Heart sounds: Normal heart sounds.  Pulmonary:     Effort: Pulmonary effort is normal. No respiratory distress.     Breath sounds: Normal breath sounds.  Musculoskeletal:     Cervical back: Neck supple.  Skin:    General: Skin is dry.     Comments: SEE IMAGE BELOW: There is a triangular shaped  superficial open wound of the anterior left lower leg.  There is surrounding erythema and swelling but no significant warmth.  No drainage from the wound.  No calf tenderness.  Neurological:     General: No focal deficit present.     Mental Status: She is alert. Mental status is at baseline.  Motor: No weakness.     Gait: Gait normal.  Psychiatric:        Mood and Affect: Mood normal.        Behavior: Behavior normal.        Thought Content: Thought content normal.      UC Treatments / Results  Labs (all labs ordered are listed, but only abnormal results are displayed) Labs Reviewed - No data to display  EKG   Radiology No results found.  Procedures Procedures (including critical care time)  Medications Ordered in UC Medications - No data to display  Initial Impression / Assessment and Plan / UC Course  I have reviewed the triage vital signs and the nursing notes.  Pertinent labs & imaging results that were available during my care of the patient were reviewed by me and considered in my medical decision making (see chart for details).  87 year old female presenting for open wound to the left lower leg with surrounding erythema and swelling.  Exam is significant for poorly healing open wound of the lower leg with subsequent secondary cellulitis.  Patient to be treated at this time with Keflex and doxycycline as well as mupirocin.  I have cleaned the wound bed with chlorhexidine and saline.  Then I applied bacitracin and covered with nonadherent bandage and Coban.  Patient tolerated well.  Advise close monitoring and to continue with her Lasix and elevation of her lower extremities.  Advise making follow-up appointment with PCP next week for recheck.  I did review going to the ED sooner for any acute worsening of symptoms and advised that sometimes these infections can significantly worsen if not monitor closely.   Final Clinical Impressions(s) / UC Diagnoses   Final  diagnoses:  Cellulitis of leg, left  Open wound of left lower leg, initial encounter     Discharge Instructions      -The wound is infected.  I have sent 2 different antibiotics.  Make sure to clean the wound every day with soap and water and then apply the ointment and cover with a nonadherent bandage. -Continue taking your Lasix and elevate your feet to help with swelling. -Make a follow-up appoint with your PCP for recheck -You need to be seen again sooner either here in the ED if the infection worsens.  For any severe acute worsening especially if the swelling and redness go further up the leg toward the knee or you have a fever, please go to emergency department.     ED Prescriptions     Medication Sig Dispense Auth. Provider   cephALEXin (KEFLEX) 500 MG capsule Take 1 capsule (500 mg total) by mouth 4 (four) times daily for 7 days. 28 capsule Eusebio Friendly B, PA-C   doxycycline (VIBRAMYCIN) 100 MG capsule Take 1 capsule (100 mg total) by mouth 2 (two) times daily for 7 days. 14 capsule Eusebio Friendly B, PA-C   mupirocin ointment (BACTROBAN) 2 % Apply 1 application topically 2 (two) times daily. 22 g Shirlee Latch, PA-C      PDMP not reviewed this encounter.   Shirlee Latch, PA-C 10/11/21 [phone]

## 2021-10-22 DIAGNOSIS — R600 Localized edema: Secondary | ICD-10-CM | POA: Diagnosis not present

## 2021-10-22 DIAGNOSIS — L03116 Cellulitis of left lower limb: Secondary | ICD-10-CM | POA: Diagnosis not present

## 2021-10-22 DIAGNOSIS — Z23 Encounter for immunization: Secondary | ICD-10-CM | POA: Diagnosis not present

## 2021-11-06 DIAGNOSIS — E538 Deficiency of other specified B group vitamins: Secondary | ICD-10-CM | POA: Diagnosis not present

## 2021-11-06 DIAGNOSIS — E119 Type 2 diabetes mellitus without complications: Secondary | ICD-10-CM | POA: Diagnosis not present

## 2021-11-11 DIAGNOSIS — E538 Deficiency of other specified B group vitamins: Secondary | ICD-10-CM | POA: Diagnosis not present

## 2021-11-11 DIAGNOSIS — E119 Type 2 diabetes mellitus without complications: Secondary | ICD-10-CM | POA: Diagnosis not present

## 2021-11-21 DIAGNOSIS — D696 Thrombocytopenia, unspecified: Secondary | ICD-10-CM | POA: Diagnosis not present

## 2021-11-21 DIAGNOSIS — Z0000 Encounter for general adult medical examination without abnormal findings: Secondary | ICD-10-CM | POA: Diagnosis not present

## 2021-11-21 DIAGNOSIS — E119 Type 2 diabetes mellitus without complications: Secondary | ICD-10-CM | POA: Diagnosis not present

## 2021-11-21 DIAGNOSIS — E538 Deficiency of other specified B group vitamins: Secondary | ICD-10-CM | POA: Diagnosis not present

## 2021-11-26 ENCOUNTER — Ambulatory Visit (INDEPENDENT_AMBULATORY_CARE_PROVIDER_SITE_OTHER): Payer: PPO

## 2021-11-26 DIAGNOSIS — I442 Atrioventricular block, complete: Secondary | ICD-10-CM

## 2021-11-26 LAB — CUP PACEART REMOTE DEVICE CHECK
Battery Remaining Longevity: 92 mo
Battery Remaining Percentage: 71 %
Battery Voltage: 3.01 V
Brady Statistic AP VP Percent: 3.3 %
Brady Statistic AP VS Percent: 1 %
Brady Statistic AS VP Percent: 96 %
Brady Statistic AS VS Percent: 1 %
Brady Statistic RA Percent Paced: 2.6 %
Brady Statistic RV Percent Paced: 99 %
Date Time Interrogation Session: 20221213020029
Implantable Lead Implant Date: 20190913
Implantable Lead Implant Date: 20190913
Implantable Lead Location: 753859
Implantable Lead Location: 753860
Implantable Lead Model: 1944
Implantable Lead Model: 1948
Implantable Pulse Generator Implant Date: 20190913
Lead Channel Impedance Value: 440 Ohm
Lead Channel Impedance Value: 710 Ohm
Lead Channel Pacing Threshold Amplitude: 0.5 V
Lead Channel Pacing Threshold Amplitude: 0.5 V
Lead Channel Pacing Threshold Pulse Width: 0.4 ms
Lead Channel Pacing Threshold Pulse Width: 0.4 ms
Lead Channel Sensing Intrinsic Amplitude: 10.6 mV
Lead Channel Sensing Intrinsic Amplitude: 5 mV
Lead Channel Setting Pacing Amplitude: 0.75 V
Lead Channel Setting Pacing Amplitude: 2 V
Lead Channel Setting Pacing Pulse Width: 0.4 ms
Lead Channel Setting Sensing Sensitivity: 4 mV
Pulse Gen Model: 2272
Pulse Gen Serial Number: 9064209

## 2021-12-06 NOTE — Progress Notes (Signed)
Remote pacemaker transmission.   

## 2022-02-25 ENCOUNTER — Ambulatory Visit (INDEPENDENT_AMBULATORY_CARE_PROVIDER_SITE_OTHER): Payer: PPO

## 2022-02-25 DIAGNOSIS — I442 Atrioventricular block, complete: Secondary | ICD-10-CM | POA: Diagnosis not present

## 2022-02-25 LAB — CUP PACEART REMOTE DEVICE CHECK
Battery Remaining Longevity: 89 mo
Battery Remaining Percentage: 69 %
Battery Voltage: 3.01 V
Brady Statistic AP VP Percent: 2.4 %
Brady Statistic AP VS Percent: 1 %
Brady Statistic AS VP Percent: 97 %
Brady Statistic AS VS Percent: 1 %
Brady Statistic RA Percent Paced: 1.9 %
Brady Statistic RV Percent Paced: 99 %
Date Time Interrogation Session: 20230314070206
Implantable Lead Implant Date: 20190913
Implantable Lead Implant Date: 20190913
Implantable Lead Location: 753859
Implantable Lead Location: 753860
Implantable Lead Model: 1944
Implantable Lead Model: 1948
Implantable Pulse Generator Implant Date: 20190913
Lead Channel Impedance Value: 480 Ohm
Lead Channel Impedance Value: 730 Ohm
Lead Channel Pacing Threshold Amplitude: 0.5 V
Lead Channel Pacing Threshold Amplitude: 0.5 V
Lead Channel Pacing Threshold Pulse Width: 0.4 ms
Lead Channel Pacing Threshold Pulse Width: 0.4 ms
Lead Channel Sensing Intrinsic Amplitude: 10.6 mV
Lead Channel Sensing Intrinsic Amplitude: 5 mV
Lead Channel Setting Pacing Amplitude: 0.75 V
Lead Channel Setting Pacing Amplitude: 2 V
Lead Channel Setting Pacing Pulse Width: 0.4 ms
Lead Channel Setting Sensing Sensitivity: 4 mV
Pulse Gen Model: 2272
Pulse Gen Serial Number: 9064209

## 2022-03-11 NOTE — Progress Notes (Signed)
Remote pacemaker transmission.   

## 2022-03-20 DIAGNOSIS — E119 Type 2 diabetes mellitus without complications: Secondary | ICD-10-CM | POA: Diagnosis not present

## 2022-03-20 DIAGNOSIS — M5441 Lumbago with sciatica, right side: Secondary | ICD-10-CM | POA: Diagnosis not present

## 2022-04-04 DIAGNOSIS — M4316 Spondylolisthesis, lumbar region: Secondary | ICD-10-CM | POA: Diagnosis not present

## 2022-04-04 DIAGNOSIS — M5430 Sciatica, unspecified side: Secondary | ICD-10-CM | POA: Diagnosis not present

## 2022-04-04 DIAGNOSIS — M5116 Intervertebral disc disorders with radiculopathy, lumbar region: Secondary | ICD-10-CM | POA: Diagnosis not present

## 2022-04-04 DIAGNOSIS — M5136 Other intervertebral disc degeneration, lumbar region: Secondary | ICD-10-CM | POA: Diagnosis not present

## 2022-04-04 DIAGNOSIS — E119 Type 2 diabetes mellitus without complications: Secondary | ICD-10-CM | POA: Diagnosis not present

## 2022-04-18 DIAGNOSIS — M5116 Intervertebral disc disorders with radiculopathy, lumbar region: Secondary | ICD-10-CM | POA: Diagnosis not present

## 2022-05-15 DIAGNOSIS — E538 Deficiency of other specified B group vitamins: Secondary | ICD-10-CM | POA: Diagnosis not present

## 2022-05-15 DIAGNOSIS — E119 Type 2 diabetes mellitus without complications: Secondary | ICD-10-CM | POA: Diagnosis not present

## 2022-05-22 DIAGNOSIS — D696 Thrombocytopenia, unspecified: Secondary | ICD-10-CM | POA: Diagnosis not present

## 2022-05-22 DIAGNOSIS — E538 Deficiency of other specified B group vitamins: Secondary | ICD-10-CM | POA: Diagnosis not present

## 2022-05-22 DIAGNOSIS — E039 Hypothyroidism, unspecified: Secondary | ICD-10-CM | POA: Diagnosis not present

## 2022-05-22 DIAGNOSIS — I442 Atrioventricular block, complete: Secondary | ICD-10-CM | POA: Diagnosis not present

## 2022-05-22 DIAGNOSIS — E119 Type 2 diabetes mellitus without complications: Secondary | ICD-10-CM | POA: Diagnosis not present

## 2022-05-22 DIAGNOSIS — Z0000 Encounter for general adult medical examination without abnormal findings: Secondary | ICD-10-CM | POA: Diagnosis not present

## 2022-05-27 ENCOUNTER — Ambulatory Visit (INDEPENDENT_AMBULATORY_CARE_PROVIDER_SITE_OTHER): Payer: PPO

## 2022-05-27 DIAGNOSIS — I442 Atrioventricular block, complete: Secondary | ICD-10-CM | POA: Diagnosis not present

## 2022-05-27 LAB — CUP PACEART REMOTE DEVICE CHECK
Battery Remaining Longevity: 85 mo
Battery Remaining Percentage: 66 %
Battery Voltage: 2.99 V
Brady Statistic AP VP Percent: 2.1 %
Brady Statistic AP VS Percent: 1 %
Brady Statistic AS VP Percent: 98 %
Brady Statistic AS VS Percent: 1 %
Brady Statistic RA Percent Paced: 1.8 %
Brady Statistic RV Percent Paced: 99 %
Date Time Interrogation Session: 20230613020028
Implantable Lead Implant Date: 20190913
Implantable Lead Implant Date: 20190913
Implantable Lead Location: 753859
Implantable Lead Location: 753860
Implantable Lead Model: 1944
Implantable Lead Model: 1948
Implantable Pulse Generator Implant Date: 20190913
Lead Channel Impedance Value: 490 Ohm
Lead Channel Impedance Value: 680 Ohm
Lead Channel Pacing Threshold Amplitude: 0.5 V
Lead Channel Pacing Threshold Amplitude: 0.5 V
Lead Channel Pacing Threshold Pulse Width: 0.4 ms
Lead Channel Pacing Threshold Pulse Width: 0.4 ms
Lead Channel Sensing Intrinsic Amplitude: 5 mV
Lead Channel Sensing Intrinsic Amplitude: 7 mV
Lead Channel Setting Pacing Amplitude: 0.75 V
Lead Channel Setting Pacing Amplitude: 2 V
Lead Channel Setting Pacing Pulse Width: 0.4 ms
Lead Channel Setting Sensing Sensitivity: 4 mV
Pulse Gen Model: 2272
Pulse Gen Serial Number: 9064209

## 2022-06-10 NOTE — Progress Notes (Signed)
Remote pacemaker transmission.   

## 2022-08-26 ENCOUNTER — Ambulatory Visit (INDEPENDENT_AMBULATORY_CARE_PROVIDER_SITE_OTHER): Payer: PPO

## 2022-08-26 DIAGNOSIS — I442 Atrioventricular block, complete: Secondary | ICD-10-CM | POA: Diagnosis not present

## 2022-08-29 LAB — CUP PACEART REMOTE DEVICE CHECK
Battery Remaining Longevity: 82 mo
Battery Remaining Percentage: 64 %
Battery Voltage: 2.99 V
Brady Statistic AP VP Percent: 1.7 %
Brady Statistic AP VS Percent: 1 %
Brady Statistic AS VP Percent: 98 %
Brady Statistic AS VS Percent: 1 %
Brady Statistic RA Percent Paced: 1.4 %
Brady Statistic RV Percent Paced: 99 %
Date Time Interrogation Session: 20230912020033
Implantable Lead Implant Date: 20190913
Implantable Lead Implant Date: 20190913
Implantable Lead Location: 753859
Implantable Lead Location: 753860
Implantable Lead Model: 1944
Implantable Lead Model: 1948
Implantable Pulse Generator Implant Date: 20190913
Lead Channel Impedance Value: 490 Ohm
Lead Channel Impedance Value: 730 Ohm
Lead Channel Pacing Threshold Amplitude: 0.5 V
Lead Channel Pacing Threshold Amplitude: 0.5 V
Lead Channel Pacing Threshold Pulse Width: 0.4 ms
Lead Channel Pacing Threshold Pulse Width: 0.4 ms
Lead Channel Sensing Intrinsic Amplitude: 4.8 mV
Lead Channel Sensing Intrinsic Amplitude: 5 mV
Lead Channel Setting Pacing Amplitude: 0.75 V
Lead Channel Setting Pacing Amplitude: 2 V
Lead Channel Setting Pacing Pulse Width: 0.4 ms
Lead Channel Setting Sensing Sensitivity: 4 mV
Pulse Gen Model: 2272
Pulse Gen Serial Number: 9064209

## 2022-09-11 NOTE — Progress Notes (Signed)
Remote pacemaker transmission.   

## 2022-11-11 ENCOUNTER — Encounter: Payer: Self-pay | Admitting: Internal Medicine

## 2022-11-11 ENCOUNTER — Ambulatory Visit: Payer: PPO | Attending: Internal Medicine | Admitting: Internal Medicine

## 2022-11-11 VITALS — BP 138/70 | HR 74 | Ht 62.0 in | Wt 144.0 lb

## 2022-11-11 DIAGNOSIS — I10 Essential (primary) hypertension: Secondary | ICD-10-CM

## 2022-11-11 DIAGNOSIS — I442 Atrioventricular block, complete: Secondary | ICD-10-CM | POA: Diagnosis not present

## 2022-11-11 DIAGNOSIS — Z950 Presence of cardiac pacemaker: Secondary | ICD-10-CM | POA: Diagnosis not present

## 2022-11-11 DIAGNOSIS — I5032 Chronic diastolic (congestive) heart failure: Secondary | ICD-10-CM

## 2022-11-11 LAB — CUP PACEART INCLINIC DEVICE CHECK
Battery Remaining Longevity: 81 mo
Battery Voltage: 2.99 V
Brady Statistic RA Percent Paced: 1.2 %
Brady Statistic RV Percent Paced: 99.76 %
Date Time Interrogation Session: 20231128130521
Implantable Lead Connection Status: 753985
Implantable Lead Connection Status: 753985
Implantable Lead Implant Date: 20190913
Implantable Lead Implant Date: 20190913
Implantable Lead Location: 753859
Implantable Lead Location: 753860
Implantable Lead Model: 1944
Implantable Lead Model: 1948
Implantable Pulse Generator Implant Date: 20190913
Lead Channel Impedance Value: 512.5 Ohm
Lead Channel Impedance Value: 750 Ohm
Lead Channel Pacing Threshold Amplitude: 0.5 V
Lead Channel Pacing Threshold Amplitude: 0.5 V
Lead Channel Pacing Threshold Amplitude: 0.5 V
Lead Channel Pacing Threshold Amplitude: 0.5 V
Lead Channel Pacing Threshold Pulse Width: 0.4 ms
Lead Channel Pacing Threshold Pulse Width: 0.4 ms
Lead Channel Pacing Threshold Pulse Width: 0.4 ms
Lead Channel Pacing Threshold Pulse Width: 0.4 ms
Lead Channel Sensing Intrinsic Amplitude: 3 mV
Lead Channel Sensing Intrinsic Amplitude: 5 mV
Lead Channel Setting Pacing Amplitude: 0.75 V
Lead Channel Setting Pacing Amplitude: 2 V
Lead Channel Setting Pacing Pulse Width: 0.4 ms
Lead Channel Setting Sensing Sensitivity: 4 mV
Pulse Gen Model: 2272
Pulse Gen Serial Number: 9064209

## 2022-11-11 LAB — PACEMAKER DEVICE OBSERVATION

## 2022-11-11 NOTE — Patient Instructions (Signed)
Medication Instructions:  - Your physician has recommended you make the following change in your medication:   1) CHANGE Lasix (furosemide) 20 mg: - take 1 tablet EVERY OTHER day x 4 doses - then resume 1 tablet daily as needed  *If you need a refill on your cardiac medications before your next appointment, please call your pharmacy*   Lab Work: - none ordered  If you have labs (blood work) drawn today and your tests are completely normal, you will receive your results only by: MyChart Message (if you have MyChart) OR A paper copy in the mail If you have any lab test that is abnormal or we need to change your treatment, we will call you to review the results.   Testing/Procedures: - none ordered   Follow-Up: At [facility], you and your health needs are our priority.  As part of our continuing mission to provide you with exceptional heart care, we have created designated Provider Care Teams.  These Care Teams include your primary Cardiologist (physician) and Advanced Practice Providers (APPs -  Physician Assistants and Nurse Practitioners) who all work together to provide you with the care you need, when you need it.  We recommend signing up for the patient portal called "MyChart".  Sign up information is provided on this After Visit Summary.  MyChart is used to connect with patients for Virtual Visits (Telemedicine).  Patients are able to view lab/test results, encounter notes, upcoming appointments, etc.  Non-urgent messages can be sent to your provider as well.   To learn more about what you can do with MyChart, go to [email or web address].    Your next appointment:   1 year(s)  The format for your next appointment:   In Person  Provider:   Sherryl Manges, MD    Other Instructions N/a  Important Information About Sugar

## 2022-11-11 NOTE — Progress Notes (Signed)
Patient Care Team: Danella Penton, MD as PCP - General (Internal Medicine)   HPI  Erin Browning is a 88 y.o. female Seen in follow-up for pacemaker-St Jude  implanted 9/19 (GT) having presented with complete heart block .   Date Cr K Hgb PLT  9/19 1.5 <<(0.9) 4.1    5/20  1.1 3.9    5/21 1.1 4.1 14.7 91  5/22 0.9 3.9 14.4 89  6/23 0.9 4.6 14.8 71   The patient denies chest pain, shortness of breath, nocturnal dyspnea, orthopnea or peripheral edema.  There have been no palpitations, lightheadedness or syncope.      Continues to live by herself.  Daughter brings her meals daily.  Chronic thrombocytopenia  Records and Results Reviewed   Past Medical History:  Diagnosis Date   Arthritis    ankles   Blood clot in vein    left leg, several yrs ago   CHB (complete heart block) (HCC)    dual chamber St Jude pacemaker insertion 08/27/18   Wears dentures    full upper and lower   Wears hearing aid    bilateral    Past Surgical History:  Procedure Laterality Date   BROW LIFT Bilateral 10/30/2015   Procedure: BLEPHAROPLASTY;  Surgeon: Imagene Riches, MD;  Location: [facility];  Service: Ophthalmology;  Laterality: Bilateral;   CATARACT EXTRACTION W/PHACO Left 01/16/2016   Procedure: CATARACT EXTRACTION PHACO AND INTRAOCULAR LENS PLACEMENT (IOC);  Surgeon: Lockie Mola, MD;  Location: [facility];  Service: Ophthalmology;  Laterality: Left;   CATARACT EXTRACTION W/PHACO Right 02/18/2016   Procedure: CATARACT EXTRACTION PHACO AND INTRAOCULAR LENS PLACEMENT (IOC);  Surgeon: Lockie Mola, MD;  Location: [facility];  Service: Ophthalmology;  Laterality: Right;   CHOLECYSTECTOMY     COLONOSCOPY     ENTROPIAN REPAIR Bilateral 10/30/2015   Procedure: REPAIR OF ENTROPION SUTURES LEFT EYE  EXTENSIVE LEFT EYE;  Surgeon: Imagene Riches, MD;  Location: [facility];  Service: Ophthalmology;  Laterality: Bilateral;   PACEMAKER IMPLANT N/A  08/27/2018   Procedure: PACEMAKER IMPLANT;  Surgeon: Duke Salvia, MD;  Location: [facility] INVASIVE CV LAB;  Service: Cardiovascular;  Laterality: N/A;   PTOSIS REPAIR Bilateral 10/30/2015   Procedure: PTOSIS REPAIR;  Surgeon: Imagene Riches, MD;  Location: [facility];  Service: Ophthalmology;  Laterality: Bilateral;    Current Meds  Medication Sig   cyanocobalamin (,VITAMIN B-12,) 1000 MCG/ML injection Inject 1,000 mcg into the muscle every 30 (thirty) days.   furosemide (LASIX) 20 MG tablet Take 20 mg by mouth daily as needed.   MAGNESIUM OXIDE 400 PO Take 1 tablet by mouth every other day.   mupirocin ointment (BACTROBAN) 2 % Apply 1 application topically 2 (two) times daily.   triamterene-hydrochlorothiazide (MAXZIDE-25) 37.5-25 MG tablet Take 1 tablet by mouth daily.    No Known Allergies    Review of Systems negative except from HPI and PMH  Physical Exam BP 138/70 (BP Location: Right Arm, Patient Position: Sitting, Cuff Size: Normal)   Pulse 74   Ht 5\' 2"  (1.575 m)   Wt 144 lb (65.3 kg)   SpO2 98%   BMI 26.34 kg/m  Well developed and well nourished in no acute distress HENT normal Neck supple with JVP-flat Clear Device pocket well healed; without hematoma or erythema.  There is no tethering  Regular rate and rhythm, no  gallop No/ murmur Abd-soft with active BS No Clubbing cyanosis 2+ edema  Skin-warm and dry A & Oriented  Grossly normal sensory and motor function  ECG sinus with P synchronous pacing with occasional PVC  Device function is  normal. Programming changes none  See Paceart for details    Assessment and  Plan  High-grade heart block->> complete   Pacemaker-Saint Jude  HFpEF-chronic  HTN  SCAF (duration less than 2 minutes)  Thrombocytopenia    Blood pressure well controlled  Volume overloaded with peripheral edema. We have discussed the physiology of heart failure including the importance of salt restriction and fluid restriction and  have reviewed sources of dietary salt and water.  We will have her increase her furosemide i.e. take it every other day x4 doses  Continue triamterene hydrochlorothiazide

## 2022-11-17 DIAGNOSIS — E538 Deficiency of other specified B group vitamins: Secondary | ICD-10-CM | POA: Diagnosis not present

## 2022-11-17 DIAGNOSIS — E119 Type 2 diabetes mellitus without complications: Secondary | ICD-10-CM | POA: Diagnosis not present

## 2022-11-24 DIAGNOSIS — E119 Type 2 diabetes mellitus without complications: Secondary | ICD-10-CM | POA: Diagnosis not present

## 2022-11-24 DIAGNOSIS — E538 Deficiency of other specified B group vitamins: Secondary | ICD-10-CM | POA: Diagnosis not present

## 2022-11-24 DIAGNOSIS — Z0000 Encounter for general adult medical examination without abnormal findings: Secondary | ICD-10-CM | POA: Diagnosis not present

## 2022-11-25 ENCOUNTER — Ambulatory Visit (INDEPENDENT_AMBULATORY_CARE_PROVIDER_SITE_OTHER): Payer: PPO

## 2022-11-25 DIAGNOSIS — I442 Atrioventricular block, complete: Secondary | ICD-10-CM

## 2022-11-25 LAB — CUP PACEART REMOTE DEVICE CHECK
Battery Remaining Longevity: 79 mo
Battery Remaining Percentage: 62 %
Battery Voltage: 3.01 V
Brady Statistic AP VP Percent: 1 %
Brady Statistic AP VS Percent: 0 %
Brady Statistic AS VP Percent: 99 %
Brady Statistic AS VS Percent: 1 %
Brady Statistic RA Percent Paced: 1 %
Brady Statistic RV Percent Paced: 99 %
Date Time Interrogation Session: 20231212040750
Implantable Lead Connection Status: 753985
Implantable Lead Connection Status: 753985
Implantable Lead Implant Date: 20190913
Implantable Lead Implant Date: 20190913
Implantable Lead Location: 753859
Implantable Lead Location: 753860
Implantable Lead Model: 1944
Implantable Lead Model: 1948
Implantable Pulse Generator Implant Date: 20190913
Lead Channel Impedance Value: 410 Ohm
Lead Channel Impedance Value: 680 Ohm
Lead Channel Pacing Threshold Amplitude: 0.5 V
Lead Channel Pacing Threshold Amplitude: 0.625 V
Lead Channel Pacing Threshold Pulse Width: 0.4 ms
Lead Channel Pacing Threshold Pulse Width: 0.4 ms
Lead Channel Sensing Intrinsic Amplitude: 3 mV
Lead Channel Sensing Intrinsic Amplitude: 5 mV
Lead Channel Setting Pacing Amplitude: 0.875
Lead Channel Setting Pacing Amplitude: 2 V
Lead Channel Setting Pacing Pulse Width: 0.4 ms
Lead Channel Setting Sensing Sensitivity: 4 mV
Pulse Gen Model: 2272
Pulse Gen Serial Number: 9064209

## 2022-12-24 NOTE — Progress Notes (Signed)
Remote pacemaker transmission.   

## 2023-02-24 ENCOUNTER — Ambulatory Visit (INDEPENDENT_AMBULATORY_CARE_PROVIDER_SITE_OTHER): Payer: PPO

## 2023-02-24 DIAGNOSIS — I442 Atrioventricular block, complete: Secondary | ICD-10-CM

## 2023-02-26 ENCOUNTER — Encounter: Payer: Self-pay | Admitting: Internal Medicine

## 2023-02-26 LAB — CUP PACEART REMOTE DEVICE CHECK
Battery Remaining Longevity: 76 mo
Battery Remaining Percentage: 59 %
Battery Voltage: 2.99 V
Brady Statistic AP VP Percent: 1 %
Brady Statistic AP VS Percent: 1 %
Brady Statistic AS VP Percent: 99 %
Brady Statistic AS VS Percent: 1 %
Brady Statistic RA Percent Paced: 1 %
Brady Statistic RV Percent Paced: 99 %
Date Time Interrogation Session: 20240312055021
Implantable Lead Connection Status: 753985
Implantable Lead Connection Status: 753985
Implantable Lead Implant Date: 20190913
Implantable Lead Implant Date: 20190913
Implantable Lead Location: 753859
Implantable Lead Location: 753860
Implantable Lead Model: 1944
Implantable Lead Model: 1948
Implantable Pulse Generator Implant Date: 20190913
Lead Channel Impedance Value: 440 Ohm
Lead Channel Impedance Value: 700 Ohm
Lead Channel Pacing Threshold Amplitude: 0.5 V
Lead Channel Pacing Threshold Amplitude: 0.625 V
Lead Channel Pacing Threshold Pulse Width: 0.4 ms
Lead Channel Pacing Threshold Pulse Width: 0.4 ms
Lead Channel Sensing Intrinsic Amplitude: 12 mV
Lead Channel Sensing Intrinsic Amplitude: 5 mV
Lead Channel Setting Pacing Amplitude: 0.875
Lead Channel Setting Pacing Amplitude: 2 V
Lead Channel Setting Pacing Pulse Width: 0.4 ms
Lead Channel Setting Sensing Sensitivity: 4 mV
Pulse Gen Model: 2272
Pulse Gen Serial Number: 9064209

## 2023-04-07 NOTE — Progress Notes (Signed)
Remote pacemaker transmission.   

## 2023-05-19 DIAGNOSIS — E538 Deficiency of other specified B group vitamins: Secondary | ICD-10-CM | POA: Diagnosis not present

## 2023-05-19 DIAGNOSIS — E119 Type 2 diabetes mellitus without complications: Secondary | ICD-10-CM | POA: Diagnosis not present

## 2023-05-26 ENCOUNTER — Ambulatory Visit: Payer: PPO

## 2023-05-26 DIAGNOSIS — D696 Thrombocytopenia, unspecified: Secondary | ICD-10-CM | POA: Diagnosis not present

## 2023-05-26 DIAGNOSIS — I442 Atrioventricular block, complete: Secondary | ICD-10-CM | POA: Diagnosis not present

## 2023-05-26 DIAGNOSIS — E538 Deficiency of other specified B group vitamins: Secondary | ICD-10-CM | POA: Diagnosis not present

## 2023-05-26 DIAGNOSIS — Z0000 Encounter for general adult medical examination without abnormal findings: Secondary | ICD-10-CM | POA: Diagnosis not present

## 2023-05-26 DIAGNOSIS — E119 Type 2 diabetes mellitus without complications: Secondary | ICD-10-CM | POA: Diagnosis not present

## 2023-05-26 LAB — CUP PACEART REMOTE DEVICE CHECK
Battery Remaining Longevity: 74 mo
Battery Remaining Percentage: 57 %
Battery Voltage: 2.99 V
Brady Statistic AP VP Percent: 1.2 %
Brady Statistic AP VS Percent: 1 %
Brady Statistic AS VP Percent: 99 %
Brady Statistic AS VS Percent: 1 %
Brady Statistic RA Percent Paced: 1 %
Brady Statistic RV Percent Paced: 99 %
Date Time Interrogation Session: 20240611020018
Implantable Lead Connection Status: 753985
Implantable Lead Connection Status: 753985
Implantable Lead Implant Date: 20190913
Implantable Lead Implant Date: 20190913
Implantable Lead Location: 753859
Implantable Lead Location: 753860
Implantable Lead Model: 1944
Implantable Lead Model: 1948
Implantable Pulse Generator Implant Date: 20190913
Lead Channel Impedance Value: 550 Ohm
Lead Channel Impedance Value: 800 Ohm
Lead Channel Pacing Threshold Amplitude: 0.5 V
Lead Channel Pacing Threshold Amplitude: 0.5 V
Lead Channel Pacing Threshold Pulse Width: 0.4 ms
Lead Channel Pacing Threshold Pulse Width: 0.4 ms
Lead Channel Sensing Intrinsic Amplitude: 12 mV
Lead Channel Sensing Intrinsic Amplitude: 5 mV
Lead Channel Setting Pacing Amplitude: 0.75 V
Lead Channel Setting Pacing Amplitude: 2 V
Lead Channel Setting Pacing Pulse Width: 0.4 ms
Lead Channel Setting Sensing Sensitivity: 4 mV
Pulse Gen Model: 2272
Pulse Gen Serial Number: 9064209

## 2023-06-23 NOTE — Progress Notes (Signed)
Remote pacemaker transmission.   

## 2023-08-25 ENCOUNTER — Ambulatory Visit (INDEPENDENT_AMBULATORY_CARE_PROVIDER_SITE_OTHER): Payer: PPO

## 2023-08-25 DIAGNOSIS — I442 Atrioventricular block, complete: Secondary | ICD-10-CM | POA: Diagnosis not present

## 2023-08-25 LAB — CUP PACEART REMOTE DEVICE CHECK
Battery Remaining Longevity: 71 mo
Battery Remaining Percentage: 54 %
Battery Voltage: 2.99 V
Brady Statistic AP VP Percent: 1.2 %
Brady Statistic AP VS Percent: 1 %
Brady Statistic AS VP Percent: 99 %
Brady Statistic AS VS Percent: 1 %
Brady Statistic RA Percent Paced: 1.1 %
Brady Statistic RV Percent Paced: 99 %
Date Time Interrogation Session: 20240910111348
Implantable Lead Connection Status: 753985
Implantable Lead Connection Status: 753985
Implantable Lead Implant Date: 20190913
Implantable Lead Implant Date: 20190913
Implantable Lead Location: 753859
Implantable Lead Location: 753860
Implantable Lead Model: 1944
Implantable Lead Model: 1948
Implantable Pulse Generator Implant Date: 20190913
Lead Channel Impedance Value: 550 Ohm
Lead Channel Impedance Value: 800 Ohm
Lead Channel Pacing Threshold Amplitude: 0.5 V
Lead Channel Pacing Threshold Amplitude: 0.625 V
Lead Channel Pacing Threshold Pulse Width: 0.4 ms
Lead Channel Pacing Threshold Pulse Width: 0.4 ms
Lead Channel Sensing Intrinsic Amplitude: 12 mV
Lead Channel Sensing Intrinsic Amplitude: 5 mV
Lead Channel Setting Pacing Amplitude: 0.875
Lead Channel Setting Pacing Amplitude: 2 V
Lead Channel Setting Pacing Pulse Width: 0.4 ms
Lead Channel Setting Sensing Sensitivity: 4 mV
Pulse Gen Model: 2272
Pulse Gen Serial Number: 9064209

## 2023-08-31 ENCOUNTER — Encounter: Payer: Self-pay | Admitting: Internal Medicine

## 2023-09-11 NOTE — Progress Notes (Signed)
Remote pacemaker transmission.   

## 2023-11-16 DIAGNOSIS — Z950 Presence of cardiac pacemaker: Secondary | ICD-10-CM | POA: Insufficient documentation

## 2023-11-17 ENCOUNTER — Encounter: Payer: Self-pay | Admitting: Internal Medicine

## 2023-11-17 ENCOUNTER — Ambulatory Visit: Payer: PPO | Attending: Internal Medicine | Admitting: Internal Medicine

## 2023-11-17 VITALS — BP 122/70 | HR 77 | Ht 61.0 in | Wt 139.2 lb

## 2023-11-17 DIAGNOSIS — Z950 Presence of cardiac pacemaker: Secondary | ICD-10-CM

## 2023-11-17 DIAGNOSIS — I442 Atrioventricular block, complete: Secondary | ICD-10-CM | POA: Diagnosis not present

## 2023-11-17 LAB — PACEMAKER DEVICE OBSERVATION

## 2023-11-17 NOTE — Progress Notes (Signed)
Patient Care Team: Danella Penton, MD as PCP - General (Internal Medicine)   HPI  Erin Browning is a 89 y.o. female Seen in follow-up for pacemaker-St Jude  implanted 9/19 (GT) having presented with complete heart block .   Date Cr K Hgb PLT  9/19 1.5 <<(0.9) 4.1    5/20  1.1 3.9    5/21 1.1 4.1 14.7 91  5/22 0.9 3.9 14.4 89  6/23 0.9 4.6 14.8 71  6/24 (CE)  1.0 3.7 13.6 95     No new complaints  function status is stable        Records and Results Reviewed   Past Medical History:  Diagnosis Date   Arthritis    ankles   Blood clot in vein    left leg, several yrs ago   CHB (complete heart block) (HCC)    dual chamber St Jude pacemaker insertion 08/27/18   Wears dentures    full upper and lower   Wears hearing aid    bilateral    Past Surgical History:  Procedure Laterality Date   BROW LIFT Bilateral 10/30/2015   Procedure: BLEPHAROPLASTY;  Surgeon: Imagene Riches, MD;  Location: [facility];  Service: Ophthalmology;  Laterality: Bilateral;   CATARACT EXTRACTION W/PHACO Left 01/16/2016   Procedure: CATARACT EXTRACTION PHACO AND INTRAOCULAR LENS PLACEMENT (IOC);  Surgeon: Lockie Mola, MD;  Location: [facility];  Service: Ophthalmology;  Laterality: Left;   CATARACT EXTRACTION W/PHACO Right 02/18/2016   Procedure: CATARACT EXTRACTION PHACO AND INTRAOCULAR LENS PLACEMENT (IOC);  Surgeon: Lockie Mola, MD;  Location: [facility];  Service: Ophthalmology;  Laterality: Right;   CHOLECYSTECTOMY     COLONOSCOPY     ENTROPIAN REPAIR Bilateral 10/30/2015   Procedure: REPAIR OF ENTROPION SUTURES LEFT EYE  EXTENSIVE LEFT EYE;  Surgeon: Imagene Riches, MD;  Location: [facility];  Service: Ophthalmology;  Laterality: Bilateral;   PACEMAKER IMPLANT N/A 08/27/2018   Procedure: PACEMAKER IMPLANT;  Surgeon: Duke Salvia, MD;  Location: [facility] INVASIVE CV LAB;  Service: Cardiovascular;  Laterality: N/A;   PTOSIS REPAIR Bilateral  10/30/2015   Procedure: PTOSIS REPAIR;  Surgeon: Imagene Riches, MD;  Location: [facility];  Service: Ophthalmology;  Laterality: Bilateral;    Current Meds  Medication Sig   cyanocobalamin (,VITAMIN B-12,) 1000 MCG/ML injection Inject 1,000 mcg into the muscle every 30 (thirty) days.   furosemide (LASIX) 20 MG tablet Take 20 mg by mouth daily as needed.   MAGNESIUM OXIDE 400 PO Take 1 tablet by mouth every other day.   mupirocin ointment (BACTROBAN) 2 % Apply 1 application topically 2 (two) times daily.   triamterene-hydrochlorothiazide (MAXZIDE-25) 37.5-25 MG tablet Take 1 tablet by mouth daily.    No Known Allergies    Review of Systems negative except from HPI and PMH  Physical Exam BP 122/70 (BP Location: Left Arm, Patient Position: Sitting, Cuff Size: Normal)   Pulse 77   Ht 5\' 1"  (1.549 m)   Wt 139 lb 4 oz (63.2 kg)   SpO2 96%   BMI 26.31 kg/m  Well developed and well nourished in no acute distress HENT normal Neck supple with JVP-flat Clear Device pocket well healed; without hematoma or erythema.  There is no tethering  Regular rate and rhythm, no  gallop No  murmur Abd-soft with active BS No Clubbing cyanosis  edema Skin-warm and dry A & Oriented  Grossly normal sensory and motor function  ECG  P synchronous pacing at 77 19/13/41  Device function is normal. Programming changes   See Paceart for details    Assessment and  Plan  High-grade heart block->> complete   Pacemaker-Saint Jude  HFpEF-chronic  HTN  SCAF (duration less than 2 minutes)  Thrombocytopenia   BP ok\ No bleeding

## 2023-11-17 NOTE — Patient Instructions (Signed)
Medication Instructions:  Your physician recommends that you continue on your current medications as directed. Please refer to the Current Medication list given to you today.  ** Take Furosemide 20mg  - 2 tablets (40mg ) x 2 days then return to your regular doseing of Furosemide.  *If you need a refill on your cardiac medications before your next appointment, please call your pharmacy*   Lab Work: None ordered.  If you have labs (blood work) drawn today and your tests are completely normal, you will receive your results only by: MyChart Message (if you have MyChart) OR A paper copy in the mail If you have any lab test that is abnormal or we need to change your treatment, we will call you to review the results.   Testing/Procedures: None ordered.    Follow-Up: At [facility], you and your health needs are our priority.  As part of our continuing mission to provide you with exceptional heart care, we have created designated Provider Care Teams.  These Care Teams include your primary Cardiologist (physician) and Advanced Practice Providers (APPs -  Physician Assistants and Nurse Practitioners) who all work together to provide you with the care you need, when you need it.  We recommend signing up for the patient portal called "MyChart".  Sign up information is provided on this After Visit Summary.  MyChart is used to connect with patients for Virtual Visits (Telemedicine).  Patients are able to view lab/test results, encounter notes, upcoming appointments, etc.  Non-urgent messages can be sent to your provider as well.   To learn more about what you can do with MyChart, go to [email or web address].    Your next appointment:   12 months with Dr Graciela Husbands

## 2023-11-24 ENCOUNTER — Ambulatory Visit (INDEPENDENT_AMBULATORY_CARE_PROVIDER_SITE_OTHER): Payer: PPO

## 2023-11-24 DIAGNOSIS — E538 Deficiency of other specified B group vitamins: Secondary | ICD-10-CM | POA: Diagnosis not present

## 2023-11-24 DIAGNOSIS — I442 Atrioventricular block, complete: Secondary | ICD-10-CM | POA: Diagnosis not present

## 2023-11-24 DIAGNOSIS — E119 Type 2 diabetes mellitus without complications: Secondary | ICD-10-CM | POA: Diagnosis not present

## 2023-11-25 LAB — CUP PACEART REMOTE DEVICE CHECK
Battery Remaining Longevity: 67 mo
Battery Remaining Percentage: 52 %
Battery Voltage: 2.99 V
Brady Statistic AP VP Percent: 1.1 %
Brady Statistic AP VS Percent: 0 %
Brady Statistic AS VP Percent: 99 %
Brady Statistic AS VS Percent: 1 %
Brady Statistic RA Percent Paced: 1 %
Brady Statistic RV Percent Paced: 99 %
Date Time Interrogation Session: 20241210145629
Implantable Lead Connection Status: 753985
Implantable Lead Connection Status: 753985
Implantable Lead Implant Date: 20190913
Implantable Lead Implant Date: 20190913
Implantable Lead Location: 753859
Implantable Lead Location: 753860
Implantable Lead Model: 1944
Implantable Lead Model: 1948
Implantable Pulse Generator Implant Date: 20190913
Lead Channel Impedance Value: 510 Ohm
Lead Channel Impedance Value: 840 Ohm
Lead Channel Pacing Threshold Amplitude: 0.5 V
Lead Channel Pacing Threshold Amplitude: 0.5 V
Lead Channel Pacing Threshold Pulse Width: 0.4 ms
Lead Channel Pacing Threshold Pulse Width: 0.4 ms
Lead Channel Sensing Intrinsic Amplitude: 12 mV
Lead Channel Sensing Intrinsic Amplitude: 5 mV
Lead Channel Setting Pacing Amplitude: 0.75 V
Lead Channel Setting Pacing Amplitude: 2 V
Lead Channel Setting Pacing Pulse Width: 0.4 ms
Lead Channel Setting Sensing Sensitivity: 4 mV
Pulse Gen Model: 2272
Pulse Gen Serial Number: 9064209

## 2023-11-28 LAB — CUP PACEART INCLINIC DEVICE CHECK
Battery Remaining Longevity: 69 mo
Battery Voltage: 2.99 V
Brady Statistic RA Percent Paced: 1.2 %
Brady Statistic RV Percent Paced: 99.85 %
Date Time Interrogation Session: 20241203115247
Implantable Lead Connection Status: 753985
Implantable Lead Connection Status: 753985
Implantable Lead Implant Date: 20190913
Implantable Lead Implant Date: 20190913
Implantable Lead Location: 753859
Implantable Lead Location: 753860
Implantable Lead Model: 1944
Implantable Lead Model: 1948
Implantable Pulse Generator Implant Date: 20190913
Lead Channel Impedance Value: 550 Ohm
Lead Channel Impedance Value: 812.5 Ohm
Lead Channel Pacing Threshold Amplitude: 0.5 V
Lead Channel Pacing Threshold Amplitude: 0.5 V
Lead Channel Pacing Threshold Amplitude: 0.5 V
Lead Channel Pacing Threshold Amplitude: 0.5 V
Lead Channel Pacing Threshold Pulse Width: 0.4 ms
Lead Channel Pacing Threshold Pulse Width: 0.4 ms
Lead Channel Pacing Threshold Pulse Width: 0.4 ms
Lead Channel Pacing Threshold Pulse Width: 0.4 ms
Lead Channel Sensing Intrinsic Amplitude: 5 mV
Lead Channel Setting Pacing Amplitude: 0.75 V
Lead Channel Setting Pacing Amplitude: 2 V
Lead Channel Setting Pacing Pulse Width: 0.4 ms
Lead Channel Setting Sensing Sensitivity: 4 mV
Pulse Gen Model: 2272
Pulse Gen Serial Number: 9064209

## 2023-12-01 DIAGNOSIS — E119 Type 2 diabetes mellitus without complications: Secondary | ICD-10-CM | POA: Diagnosis not present

## 2023-12-01 DIAGNOSIS — E538 Deficiency of other specified B group vitamins: Secondary | ICD-10-CM | POA: Diagnosis not present

## 2023-12-01 DIAGNOSIS — Z23 Encounter for immunization: Secondary | ICD-10-CM | POA: Diagnosis not present

## 2023-12-01 DIAGNOSIS — Z0000 Encounter for general adult medical examination without abnormal findings: Secondary | ICD-10-CM | POA: Diagnosis not present

## 2023-12-31 ENCOUNTER — Encounter: Payer: Self-pay | Admitting: Internal Medicine

## 2024-01-01 NOTE — Progress Notes (Signed)
Remote pacemaker transmission.   

## 2024-01-01 NOTE — Addendum Note (Signed)
Addended by: Elease Etienne A on: 01/01/2024 03:52 PM   Modules accepted: Orders

## 2024-02-23 ENCOUNTER — Ambulatory Visit: Payer: PPO

## 2024-02-23 DIAGNOSIS — I442 Atrioventricular block, complete: Secondary | ICD-10-CM | POA: Diagnosis not present

## 2024-02-24 LAB — CUP PACEART REMOTE DEVICE CHECK
Battery Remaining Longevity: 64 mo
Battery Remaining Percentage: 49 %
Battery Voltage: 2.99 V
Brady Statistic AP VP Percent: 1.6 %
Brady Statistic AP VS Percent: 1 %
Brady Statistic AS VP Percent: 98 %
Brady Statistic AS VS Percent: 1 %
Brady Statistic RA Percent Paced: 1.3 %
Brady Statistic RV Percent Paced: 99 %
Date Time Interrogation Session: 20250311163908
Implantable Lead Connection Status: 753985
Implantable Lead Connection Status: 753985
Implantable Lead Implant Date: 20190913
Implantable Lead Implant Date: 20190913
Implantable Lead Location: 753859
Implantable Lead Location: 753860
Implantable Lead Model: 1944
Implantable Lead Model: 1948
Implantable Pulse Generator Implant Date: 20190913
Lead Channel Impedance Value: 490 Ohm
Lead Channel Impedance Value: 710 Ohm
Lead Channel Pacing Threshold Amplitude: 0.5 V
Lead Channel Pacing Threshold Amplitude: 0.5 V
Lead Channel Pacing Threshold Pulse Width: 0.4 ms
Lead Channel Pacing Threshold Pulse Width: 0.4 ms
Lead Channel Sensing Intrinsic Amplitude: 12 mV
Lead Channel Sensing Intrinsic Amplitude: 5 mV
Lead Channel Setting Pacing Amplitude: 0.75 V
Lead Channel Setting Pacing Amplitude: 2 V
Lead Channel Setting Pacing Pulse Width: 0.4 ms
Lead Channel Setting Sensing Sensitivity: 4 mV
Pulse Gen Model: 2272
Pulse Gen Serial Number: 9064209

## 2024-04-12 NOTE — Progress Notes (Signed)
 Remote pacemaker transmission.

## 2024-04-12 NOTE — Addendum Note (Signed)
 Addended by: Lott Rouleau A on: 04/12/2024 09:28 AM   Modules accepted: Orders

## 2024-05-24 ENCOUNTER — Ambulatory Visit (INDEPENDENT_AMBULATORY_CARE_PROVIDER_SITE_OTHER): Payer: PPO

## 2024-05-24 DIAGNOSIS — I442 Atrioventricular block, complete: Secondary | ICD-10-CM

## 2024-05-25 DIAGNOSIS — E119 Type 2 diabetes mellitus without complications: Secondary | ICD-10-CM | POA: Diagnosis not present

## 2024-05-25 DIAGNOSIS — E538 Deficiency of other specified B group vitamins: Secondary | ICD-10-CM | POA: Diagnosis not present

## 2024-05-25 LAB — CUP PACEART REMOTE DEVICE CHECK
Battery Remaining Longevity: 61 mo
Battery Remaining Percentage: 47 %
Battery Voltage: 2.98 V
Brady Statistic AP VP Percent: 1.6 %
Brady Statistic AP VS Percent: 1 %
Brady Statistic AS VP Percent: 98 %
Brady Statistic AS VS Percent: 1 %
Brady Statistic RA Percent Paced: 1.4 %
Brady Statistic RV Percent Paced: 99 %
Date Time Interrogation Session: 20250610020021
Implantable Lead Connection Status: 753985
Implantable Lead Connection Status: 753985
Implantable Lead Implant Date: 20190913
Implantable Lead Implant Date: 20190913
Implantable Lead Location: 753859
Implantable Lead Location: 753860
Implantable Lead Model: 1944
Implantable Lead Model: 1948
Implantable Pulse Generator Implant Date: 20190913
Lead Channel Impedance Value: 490 Ohm
Lead Channel Impedance Value: 760 Ohm
Lead Channel Pacing Threshold Amplitude: 0.5 V
Lead Channel Pacing Threshold Amplitude: 0.5 V
Lead Channel Pacing Threshold Pulse Width: 0.4 ms
Lead Channel Pacing Threshold Pulse Width: 0.4 ms
Lead Channel Sensing Intrinsic Amplitude: 12 mV
Lead Channel Sensing Intrinsic Amplitude: 5 mV
Lead Channel Setting Pacing Amplitude: 0.75 V
Lead Channel Setting Pacing Amplitude: 2 V
Lead Channel Setting Pacing Pulse Width: 0.4 ms
Lead Channel Setting Sensing Sensitivity: 4 mV
Pulse Gen Model: 2272
Pulse Gen Serial Number: 9064209

## 2024-06-01 DIAGNOSIS — E538 Deficiency of other specified B group vitamins: Secondary | ICD-10-CM | POA: Diagnosis not present

## 2024-06-01 DIAGNOSIS — Z0000 Encounter for general adult medical examination without abnormal findings: Secondary | ICD-10-CM | POA: Diagnosis not present

## 2024-06-01 DIAGNOSIS — D696 Thrombocytopenia, unspecified: Secondary | ICD-10-CM | POA: Diagnosis not present

## 2024-06-01 DIAGNOSIS — E119 Type 2 diabetes mellitus without complications: Secondary | ICD-10-CM | POA: Diagnosis not present

## 2024-06-05 ENCOUNTER — Ambulatory Visit: Payer: Self-pay | Admitting: Cardiology

## 2024-07-26 NOTE — Addendum Note (Signed)
 Addended by: VICCI SELLER A on: 07/26/2024 11:02 AM   Modules accepted: Orders

## 2024-07-26 NOTE — Progress Notes (Signed)
 Remote pacemaker transmission.

## 2024-08-23 ENCOUNTER — Ambulatory Visit: Payer: PPO

## 2024-08-23 DIAGNOSIS — I442 Atrioventricular block, complete: Secondary | ICD-10-CM | POA: Diagnosis not present

## 2024-08-24 LAB — CUP PACEART REMOTE DEVICE CHECK
Battery Remaining Longevity: 58 mo
Battery Remaining Percentage: 45 %
Battery Voltage: 2.98 V
Brady Statistic AP VP Percent: 1.4 %
Brady Statistic AP VS Percent: 1 %
Brady Statistic AS VP Percent: 98 %
Brady Statistic AS VS Percent: 1 %
Brady Statistic RA Percent Paced: 1.2 %
Brady Statistic RV Percent Paced: 99 %
Date Time Interrogation Session: 20250909020040
Implantable Lead Connection Status: 753985
Implantable Lead Connection Status: 753985
Implantable Lead Implant Date: 20190913
Implantable Lead Implant Date: 20190913
Implantable Lead Location: 753859
Implantable Lead Location: 753860
Implantable Lead Model: 1944
Implantable Lead Model: 1948
Implantable Pulse Generator Implant Date: 20190913
Lead Channel Impedance Value: 510 Ohm
Lead Channel Impedance Value: 760 Ohm
Lead Channel Pacing Threshold Amplitude: 0.375 V
Lead Channel Pacing Threshold Amplitude: 0.5 V
Lead Channel Pacing Threshold Pulse Width: 0.4 ms
Lead Channel Pacing Threshold Pulse Width: 0.4 ms
Lead Channel Sensing Intrinsic Amplitude: 12 mV
Lead Channel Sensing Intrinsic Amplitude: 5 mV
Lead Channel Setting Pacing Amplitude: 0.625
Lead Channel Setting Pacing Amplitude: 2 V
Lead Channel Setting Pacing Pulse Width: 0.4 ms
Lead Channel Setting Sensing Sensitivity: 4 mV
Pulse Gen Model: 2272
Pulse Gen Serial Number: 9064209

## 2024-09-01 NOTE — Progress Notes (Signed)
 Remote PPM Transmission

## 2024-09-04 ENCOUNTER — Ambulatory Visit: Payer: Self-pay | Admitting: Cardiology

## 2024-09-13 ENCOUNTER — Emergency Department

## 2024-09-13 ENCOUNTER — Other Ambulatory Visit: Payer: Self-pay

## 2024-09-13 ENCOUNTER — Observation Stay
Admission: EM | Admit: 2024-09-13 | Discharge: 2024-09-19 | Disposition: A | Discharge status: Skilled Nursing Facility | Attending: Internal Medicine | Admitting: Internal Medicine

## 2024-09-13 DIAGNOSIS — M79605 Pain in left leg: Secondary | ICD-10-CM | POA: Insufficient documentation

## 2024-09-13 DIAGNOSIS — G3189 Other specified degenerative diseases of nervous system: Secondary | ICD-10-CM | POA: Diagnosis not present

## 2024-09-13 DIAGNOSIS — K5900 Constipation, unspecified: Secondary | ICD-10-CM | POA: Insufficient documentation

## 2024-09-13 DIAGNOSIS — W19XXXA Unspecified fall, initial encounter: Secondary | ICD-10-CM | POA: Insufficient documentation

## 2024-09-13 DIAGNOSIS — D696 Thrombocytopenia, unspecified: Secondary | ICD-10-CM | POA: Diagnosis not present

## 2024-09-13 DIAGNOSIS — Z959 Presence of cardiac and vascular implant and graft, unspecified: Secondary | ICD-10-CM | POA: Insufficient documentation

## 2024-09-13 DIAGNOSIS — I10 Essential (primary) hypertension: Secondary | ICD-10-CM | POA: Insufficient documentation

## 2024-09-13 DIAGNOSIS — H9190 Unspecified hearing loss, unspecified ear: Secondary | ICD-10-CM

## 2024-09-13 DIAGNOSIS — Z79899 Other long term (current) drug therapy: Secondary | ICD-10-CM | POA: Insufficient documentation

## 2024-09-13 DIAGNOSIS — R531 Weakness: Secondary | ICD-10-CM | POA: Diagnosis not present

## 2024-09-13 DIAGNOSIS — G8192 Hemiplegia, unspecified affecting left dominant side: Principal | ICD-10-CM | POA: Insufficient documentation

## 2024-09-13 DIAGNOSIS — G9389 Other specified disorders of brain: Secondary | ICD-10-CM | POA: Diagnosis not present

## 2024-09-13 DIAGNOSIS — R29898 Other symptoms and signs involving the musculoskeletal system: Principal | ICD-10-CM | POA: Insufficient documentation

## 2024-09-13 DIAGNOSIS — I442 Atrioventricular block, complete: Secondary | ICD-10-CM | POA: Diagnosis not present

## 2024-09-13 DIAGNOSIS — Z950 Presence of cardiac pacemaker: Secondary | ICD-10-CM | POA: Diagnosis not present

## 2024-09-13 DIAGNOSIS — R2990 Unspecified symptoms and signs involving the nervous system: Secondary | ICD-10-CM | POA: Diagnosis not present

## 2024-09-13 LAB — CBC
HCT: 47.6 % — ABNORMAL HIGH (ref 36.0–46.0)
Hemoglobin: 15.2 g/dL — ABNORMAL HIGH (ref 12.0–15.0)
MCH: 27.2 pg (ref 26.0–34.0)
MCHC: 31.9 g/dL (ref 30.0–36.0)
MCV: 85.3 fL (ref 80.0–100.0)
Platelets: 101 K/uL — ABNORMAL LOW (ref 150–400)
RBC: 5.58 MIL/uL — ABNORMAL HIGH (ref 3.87–5.11)
RDW: 13.2 % (ref 11.5–15.5)
WBC: 7.1 K/uL (ref 4.0–10.5)
nRBC: 0 % (ref 0.0–0.2)

## 2024-09-13 LAB — COMPREHENSIVE METABOLIC PANEL WITH GFR
ALT: 14 U/L (ref 0–44)
AST: 22 U/L (ref 15–41)
Albumin: 3.7 g/dL (ref 3.5–5.0)
Alkaline Phosphatase: 101 U/L (ref 38–126)
Anion gap: 10 (ref 5–15)
BUN: 17 mg/dL (ref 8–23)
CO2: 26 mmol/L (ref 22–32)
Calcium: 9.2 mg/dL (ref 8.9–10.3)
Chloride: 103 mmol/L (ref 98–111)
Creatinine, Ser: 0.87 mg/dL (ref 0.44–1.00)
GFR, Estimated: 60 mL/min — ABNORMAL LOW (ref >60)
Glucose, Bld: 133 mg/dL — ABNORMAL HIGH (ref 70–99)
Potassium: 4.2 mmol/L (ref 3.5–5.1)
Sodium: 139 mmol/L (ref 135–145)
Total Bilirubin: 1.1 mg/dL (ref 0.0–1.2)
Total Protein: 7.1 g/dL (ref 6.5–8.1)

## 2024-09-13 LAB — URINE DRUG SCREEN, QUALITATIVE (ARMC ONLY)
Amphetamines, Ur Screen: NOT DETECTED
Barbiturates, Ur Screen: NOT DETECTED
Benzodiazepine, Ur Scrn: NOT DETECTED
Cannabinoid 50 Ng, Ur ~~LOC~~: NOT DETECTED
Cocaine Metabolite,Ur ~~LOC~~: NOT DETECTED
MDMA (Ecstasy)Ur Screen: NOT DETECTED
Methadone Scn, Ur: NOT DETECTED
Opiate, Ur Screen: NOT DETECTED
Phencyclidine (PCP) Ur S: NOT DETECTED
Tricyclic, Ur Screen: NOT DETECTED

## 2024-09-13 LAB — URINALYSIS, ROUTINE W REFLEX MICROSCOPIC
Bilirubin Urine: NEGATIVE
Glucose, UA: NEGATIVE mg/dL
Hgb urine dipstick: NEGATIVE
Ketones, ur: 5 mg/dL — AB
Leukocytes,Ua: NEGATIVE
Nitrite: NEGATIVE
Protein, ur: NEGATIVE mg/dL
Specific Gravity, Urine: 1.012 (ref 1.005–1.030)
pH: 7 (ref 5.0–8.0)

## 2024-09-13 MED ORDER — SODIUM CHLORIDE 0.9 % IV BOLUS
500.0000 mL | Freq: Once | INTRAVENOUS | Status: AC
  Administered 2024-09-13: 500 mL via INTRAVENOUS

## 2024-09-13 MED ORDER — STROKE: EARLY STAGES OF RECOVERY BOOK: Freq: Once | Status: AC

## 2024-09-13 MED ORDER — ACETAMINOPHEN 325 MG PO TABS
650.0000 mg | ORAL_TABLET | ORAL | Status: AC | PRN
  Administered 2024-09-15: 650 mg via ORAL
  Filled 2024-09-13 (×2): qty 2

## 2024-09-13 MED ORDER — OXYCODONE HCL 5 MG PO TABS
5.0000 mg | ORAL_TABLET | Freq: Four times a day (QID) | ORAL | Status: AC | PRN
Start: 2024-09-13 — End: 2024-09-14

## 2024-09-13 MED ORDER — ACETAMINOPHEN 650 MG RE SUPP: 650.0000 mg | RECTAL | Status: AC | PRN

## 2024-09-13 MED ORDER — SENNOSIDES-DOCUSATE SODIUM 8.6-50 MG PO TABS: 1.0000 | ORAL_TABLET | Freq: Every evening | ORAL | Status: DC | PRN

## 2024-09-13 MED ORDER — ACETAMINOPHEN 160 MG/5ML PO SOLN: 650.0000 mg | ORAL | Status: AC | PRN

## 2024-09-13 MED ORDER — HYDRALAZINE HCL 20 MG/ML IJ SOLN: 5.0000 mg | Freq: Four times a day (QID) | INTRAMUSCULAR | Status: AC | PRN

## 2024-09-13 NOTE — Assessment & Plan Note (Signed)
 And pain Oxycodone  5 mg p.o. every 6 hours as needed for moderate pain, 1 day ordered

## 2024-09-13 NOTE — ED Notes (Signed)
RN informed of bed assignment

## 2024-09-13 NOTE — ED Triage Notes (Signed)
 Arrives from home via [facility]. C/o fall yesterday due to left leg buckling under her.  Today patient generally weak, left leg pain, unable to ambulate at home, family wanted patient checked out.    Vs wnl.  AOx3

## 2024-09-13 NOTE — Hospital Course (Signed)
 [name] is a 89 year old female with history of hypertension, presents ED for chief concerns of left lower extremity weakness and a fall.  Vitals in the ED showed t 98, rr 16, hr 85, blood pressure 139/71, SpO2 100% on room air.  Serum sodium is 139, potassium 4.2, chloride 103, bicarb 26, BUN of 17, serum creatinine 0.87, eGFR 60, nonfasting blood glucose 133, WBC 7.1, hemoglobin 15.2, platelets 101.  CT head wo contrast: Was read as no acute intracranial abnormality.  Generalized cerebral atrophy and microvascular disease.  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  ED treatment: Sodium chloride  500 mg bolus.

## 2024-09-13 NOTE — Assessment & Plan Note (Signed)
 Status post pacemaker placement, 12 years ago

## 2024-09-13 NOTE — Assessment & Plan Note (Signed)
 With bilateral hearing aid

## 2024-09-13 NOTE — ED Provider Notes (Signed)
[facility] Provider Note    Event Date/Time   First MD Initiated Contact with Patient 09/13/24 1513     (approximate)   History   Leg Pain   HPI  Erin Browning is a 89 y.o. female with a history of a pacemaker who presents with leg weakness acute onset yesterday.  The patient states that she was reaching for some yesterday when she felt like her legs gave out on her causing her to fall.  Since that time she has felt weak in her legs like they will continue to give out on her, but it is mainly in the left leg.  She reports some pain to the lower tibial area, and some generalized pain to the leg.  She denies any direct trauma.  She denies any weakness or numbness in her arms, or any difficulty speaking or with vision.  She has no chest pain or shortness of breath.  She has no urinary symptoms.  I reviewed the past medical records.  The patient's most recent outpatient visit was on 6/18 with internal medicine for follow-up of her chronic conditions.  She has no recent ED visits or hospitalizations.   Physical Exam   Triage Vital Signs: ED Triage Vitals  Encounter Vitals Group     BP 09/13/24 1247 (!) 142/94     Girls Systolic BP Percentile --      Girls Diastolic BP Percentile --      Boys Systolic BP Percentile --      Boys Diastolic BP Percentile --      Pulse Rate 09/13/24 1247 80     Resp 09/13/24 1247 17     Temp 09/13/24 1247 97.6 F (36.4 C)     Temp src --      SpO2 --      Weight 09/13/24 1249 139 lb 5.3 oz (63.2 kg)     Height 09/13/24 1249 5' 1 (1.549 m)     Head Circumference --      Peak Flow --      Pain Score 09/13/24 1249 7     Pain Loc --      Pain Education --      Exclude from Growth Chart --     Most recent vital signs: Vitals:   09/13/24 1947 09/13/24 2347  BP: (!) 147/70 121/62  Pulse: 88 93  Resp:    Temp: 97.7 F (36.5 C) 97.9 F (36.6 C)  SpO2: 96% 93%    General: Awake, no distress.  CV:  Good peripheral  perfusion.  Resp:  Normal effort.  Abd:  No distention.  Other:  5/5 motor strength to bilateral lower extremities although slight difficulty raising the left leg fully, possibly due to pain.  Pain on range of motion of the hip and knee, but no restriction in range of motion.   ED Results / Procedures / Treatments   Labs (all labs ordered are listed, but only abnormal results are displayed) Labs Reviewed  COMPREHENSIVE METABOLIC PANEL WITH GFR - Abnormal; Notable for the following components:      Result Value   Glucose, Bld 133 (*)    GFR, Estimated 60 (*)    All other components within normal limits  CBC - Abnormal; Notable for the following components:   RBC 5.58 (*)    Hemoglobin 15.2 (*)    HCT 47.6 (*)    Platelets 101 (*)    All other components within normal limits  URINALYSIS,  ROUTINE W REFLEX MICROSCOPIC - Abnormal; Notable for the following components:   Color, Urine YELLOW (*)    APPearance HAZY (*)    Ketones, ur 5 (*)    All other components within normal limits  URINE DRUG SCREEN, QUALITATIVE ([facility] ONLY)  LIPID PANEL  HEMOGLOBIN A1C  CBG MONITORING, ED     EKG  ED ECG REPORT I, [name], the attending physician, personally viewed and interpreted this ECG.  Date: 09/13/2024 EKG Time: 1315 Rate: 87 Rhythm: Ventricular paced rhythm QRS Axis: normal Intervals: normal ST/T Wave abnormalities: normal Narrative Interpretation: no evidence of acute ischemia    RADIOLOGY   XRs are pelvis, femur, tibia/fibula: I independently viewed and interpreted the images; there are no acute fractures  CT head: No acute abnormality    PROCEDURES:  Critical Care performed: No  Procedures   MEDICATIONS ORDERED IN ED: Medications  acetaminophen  (TYLENOL ) tablet 650 mg (has no administration in time range)    Or  acetaminophen  (TYLENOL ) 160 MG/5ML solution 650 mg (has no administration in time range)    Or  acetaminophen  (TYLENOL ) suppository 650  mg (has no administration in time range)  senna-docusate (Senokot-S) tablet 1 tablet (has no administration in time range)  hydrALAZINE (APRESOLINE) injection 5 mg (has no administration in time range)  oxyCODONE  (Oxy IR/ROXICODONE ) immediate release tablet 5 mg (has no administration in time range)  sodium chloride  0.9 % bolus 500 mL (500 mLs Intravenous New Bag/Given 09/13/24 1647)   stroke: early stages of recovery book ( Does not apply Given 09/13/24 2022)     IMPRESSION / MDM / ASSESSMENT AND PLAN / ED COURSE  I reviewed the triage vital signs and the nursing notes.  89 year old female with PMH as noted above presents after her legs, particularly her left leg, seem to give out on her yesterday causing her to fall.  She has had persistent weakness and inability to ambulate since that time.  On exam she is overall well-appearing for her age.  Vital signs are normal.  She has possibly very mild weakness with trying to fully lift the left leg, but otherwise no focal neurologic deficits, and this appears to be somewhat pain mediated.  There is no focal tenderness or swelling to suggest bony injury.  Differential diagnosis includes, but is not limited to, dehydration, electrolyte abnormality, other metabolic disturbance, neuropathy, contusion, less likely CVA or TIA.  We will obtain CT head, x-rays of the left leg, lab workup, give a fluid bolus, and reassess.  Patient's presentation is most consistent with acute presentation with potential threat to life or bodily function.  The patient is on the cardiac monitor to evaluate for evidence of arrhythmia and/or significant heart rate changes.   ----------------------------------------- 6:20 PM on 09/13/2024 -----------------------------------------  X-rays are negative for fracture.  CT head is negative.  Urinalysis is still pending.  CBC and CMP show no acute abnormalities.  Since she cannot ambulate, the patient will need admission for further  management.  I consulted Dr. Sherre from the hospitalist service; based on our discussion she agrees to evaluate the patient for admission.   FINAL CLINICAL IMPRESSION(S) / ED DIAGNOSES   Final diagnoses:  Weakness of both lower extremities     Rx / DC Orders   ED Discharge Orders     None        Note:  This document was prepared using Dragon voice recognition software and may include unintentional dictation errors.    Cassis Waylon, MD 09/14/24  0058  

## 2024-09-13 NOTE — Assessment & Plan Note (Addendum)
 Acute left-sided weakness, persistent Repeat CT head without contrast for 09/14/2024 to assess for stroke Fasting lipid and A1c ordered Frequent neuro vascular checks N.p.o. pending swallow screen PT, OT Fall precaution and aspiration precaution

## 2024-09-13 NOTE — ED Triage Notes (Signed)
 See first nurse note.

## 2024-09-13 NOTE — H&P (Signed)
 History and Physical   Erin Browning FMW:[id number] DOB: September 23, 1923 DOA: 09/13/2024  PCP: Cleotilde Oneil FALCON, MD  Patient coming from: Home via EMS  I have personally briefly reviewed patient's old medical records in [facility] EMR.  Chief Concern: Left sided weakness, fall  HPI: [name] is a 89 year old female with history of hypertension, presents ED for chief concerns of left lower extremity weakness and a fall.  Vitals in the ED showed t 98, rr 16, hr 85, blood pressure 139/71, SpO2 100% on room air.  Serum sodium is 139, potassium 4.2, chloride 103, bicarb 26, BUN of 17, serum creatinine 0.87, eGFR 60, nonfasting blood glucose 133, WBC 7.1, hemoglobin 15.2, platelets 101.  CT head wo contrast: Was read as no acute intracranial abnormality.  Generalized cerebral atrophy and microvascular disease.  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  ED treatment: Sodium chloride  500 mg bolus. --------------------------------- At bedside, patient was able to confirm her first and last name, age, current location of hospital and verified her daughter and granddaughter at bedside.  She reports that she was reaching for her phone to put it on another side while she was on her walker and then she fell.  She was not able to get up.  This happened yesterday.  She denies loss of consciousness.  She reports she pressed the button to call for help and her daughter came.  Daughter found her laying on the floor and not able to get up.  She reports weakness on her left leg since that time.  She endorses some pain in the left lower extremity.  She reports this is never happened before.  She reports her left leg just gave out on her.  She denies difficulty speaking.  Social history: She lives at home on her own.  She denies current tobacco, EtOH, recreational drug use.  She is retired and previously worked in the Teaching laboratory technician at [employer].  ROS: Constitutional: no weight change, no fever ENT/Mouth: no sore throat, no rhinorrhea Eyes: no eye pain, no vision changes Cardiovascular: no chest pain, no dyspnea,  no edema, no palpitations Respiratory: no cough, no sputum, no wheezing Gastrointestinal: no nausea, no vomiting, no diarrhea, no constipation Genitourinary: no urinary incontinence, no dysuria, no hematuria Musculoskeletal: no arthralgias, no myalgias, + left lower extremity weakness Skin: no skin lesions, no pruritus, Neuro: + weakness, no loss of consciousness, no syncope Psych: no anxiety, no depression, no decrease appetite Heme/Lymph: no bruising, no bleeding  ED Course: Discussed with EDP, patient requiring hospitalization for chief concerns of strokelike symptoms  Assessment/Plan  Principal Problem:   Stroke-like symptom Active Problems:   Complete heart block (HCC)   Pacemaker   Essential hypertension   Thrombocytopenia   Weakness of left lower extremity   HOH (hard of hearing)   Assessment and Plan:  * Stroke-like symptom Acute left-sided weakness, persistent Repeat CT head without contrast for 09/14/2024 to assess for stroke Fasting lipid and A1c ordered Frequent neuro vascular checks N.p.o. pending swallow screen PT, OT Fall precaution and aspiration precaution  HOH (hard of hearing) With bilateral hearing aid  Weakness of left lower extremity And pain Oxycodone  5 mg p.o. every 6 hours as needed for moderate pain, 1 day ordered  Essential hypertension Hydralazine 5 mg IV every 6 hours as needed for SBP greater than 71, 23 days old  Complete heart block (HCC) Status post pacemaker placement, 12 years ago  Chart reviewed.   DVT prophylaxis:  Pharmacologic DVT not initiated on admission.  AM team to initiate pharmacologic DVT when the benefits outweigh the risk. Code Status: DNR/DNI, confirmed with patient's daughter, Darice, and granddaughter, Luke, at bedside Diet: Pending  swallow screen Family Communication: Updated Darice, daughter, granddaughter, Luke at bedside Disposition Plan: Pending clinical course Consults called: None at this Admission status: Telemetry medical, observation  Past Medical History:  Diagnosis Date   Arthritis    ankles   Blood clot in vein    left leg, several yrs ago   CHB (complete heart block) (HCC)    dual chamber St Jude pacemaker insertion 08/27/18   Wears dentures    full upper and lower   Wears hearing aid    bilateral   Past Surgical History:  Procedure Laterality Date   BROW LIFT Bilateral 10/30/2015   Procedure: BLEPHAROPLASTY;  Surgeon: Greig CHRISTELLA Gay, MD;  Location: [facility];  Service: Ophthalmology;  Laterality: Bilateral;   CATARACT EXTRACTION W/PHACO Left 01/16/2016   Procedure: CATARACT EXTRACTION PHACO AND INTRAOCULAR LENS PLACEMENT (IOC);  Surgeon: Dene Etienne, MD;  Location: [facility];  Service: Ophthalmology;  Laterality: Left;   CATARACT EXTRACTION W/PHACO Right 02/18/2016   Procedure: CATARACT EXTRACTION PHACO AND INTRAOCULAR LENS PLACEMENT (IOC);  Surgeon: Dene Etienne, MD;  Location: [facility];  Service: Ophthalmology;  Laterality: Right;   CHOLECYSTECTOMY     COLONOSCOPY     ENTROPIAN REPAIR Bilateral 10/30/2015   Procedure: REPAIR OF ENTROPION SUTURES LEFT EYE  EXTENSIVE LEFT EYE;  Surgeon: Greig CHRISTELLA Gay, MD;  Location: [facility];  Service: Ophthalmology;  Laterality: Bilateral;   PACEMAKER IMPLANT N/A 08/27/2018   Procedure: PACEMAKER IMPLANT;  Surgeon: Fernande Elspeth BROCKS, MD;  Location: [facility] INVASIVE CV LAB;  Service: Cardiovascular;  Laterality: N/A;   PTOSIS REPAIR Bilateral 10/30/2015   Procedure: PTOSIS REPAIR;  Surgeon: Greig CHRISTELLA Gay, MD;  Location: [facility];  Service: Ophthalmology;  Laterality: Bilateral;   Social History:  reports that she has quit smoking. Her smoking use included cigarettes. She has never used smokeless tobacco. She  reports that she does not drink alcohol and does not use drugs.  No Known Allergies Family History  Problem Relation Age of Onset   Heart attack Mother    Family history: Family history reviewed and not pertinent.  Prior to Admission medications   Medication Sig Start Date End Date Taking? Authorizing Provider  cyanocobalamin  (,VITAMIN B-12,) 1000 MCG/ML injection Inject 1,000 mcg into the muscle every 30 (thirty) days.    [provider]  furosemide (LASIX) 20 MG tablet Take 20 mg by mouth daily as needed.    [provider]  MAGNESIUM  OXIDE 400 PO Take 1 tablet by mouth every other day.    [provider]  mupirocin  ointment (BACTROBAN ) 2 % Apply 1 application topically 2 (two) times daily. 10/11/21   Arvis Jolan NOVAK, PA-C  triamterene -hydrochlorothiazide  (MAXZIDE -25) 37.5-25 MG tablet Take 1 tablet by mouth daily. 09/10/17   [provider]   Physical Exam: Vitals:   09/13/24 1247 09/13/24 1249 09/13/24 1625 09/13/24 1650  BP: (!) 142/94   139/71  Pulse: 80   85  Resp: 17   16  Temp: 97.6 F (36.4 C)   98 F (36.7 C)  TempSrc:    Oral  SpO2:   92% 100%  Weight:  63.2 kg    Height:  5' 1 (1.549 m)     Constitutional: appears  younger than chronological age, NAD, calm Eyes:  PERRL, lids and conjunctivae normal ENMT: Mucous membranes are moist. Posterior pharynx clear of any exudate or lesions. Age-appropriate dentition. Hearing appropriate Neck: normal, supple, no masses, no thyromegaly Respiratory: clear to auscultation bilaterally, no wheezing, no crackles. Normal respiratory effort. No accessory muscle use.  Cardiovascular: Regular rate and rhythm, no murmurs / rubs / gallops. No extremity edema. 2+ pedal pulses. No carotid bruits.  Abdomen: no tenderness, no masses palpated, no hepatosplenomegaly. Bowel sounds positive.  Musculoskeletal: no clubbing / cyanosis. No joint deformity upper and lower extremities. Good ROM, no contractures, no  atrophy. Normal muscle tone.  Skin: no rashes, lesions, ulcers. No induration Neurologic: Sensation intact.  Left lower extremity is more weaker than right lower extremity Psychiatric: Normal judgment and insight. Alert and oriented x 3. Normal mood.   EKG: independently reviewed, showing atrial sensed with a rate of 87, QTc 500  Chest x-ray on Admission: Not indicated at this time  CT Head Wo Contrast Result Date: 09/13/2024 CLINICAL DATA:  Status post fall. EXAM: CT HEAD WITHOUT CONTRAST TECHNIQUE: Contiguous axial images were obtained from the base of the skull through the vertex without intravenous contrast. RADIATION DOSE REDUCTION: This exam was performed according to the departmental dose-optimization program which includes automated exposure control, adjustment of the mA and/or kV according to patient size and/or use of iterative reconstruction technique. COMPARISON:  None Available. FINDINGS: Brain: There is generalized cerebral atrophy with widening of the extra-axial spaces and ventricular dilatation. There are areas of decreased attenuation within the white matter tracts of the supratentorial brain, consistent with microvascular disease changes. A small chronic left anterior external capsule white matter infarct is seen. Vascular: No hyperdense vessel or unexpected calcification. Skull: Normal. Negative for fracture or focal lesion. Sinuses/Orbits: No acute finding. Other: None. IMPRESSION: 1. Generalized cerebral atrophy and microvascular disease changes of the supratentorial brain. 2. 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 3. No acute intracranial abnormality. Electronically Signed   By: Suzen Dials M.D.   On: 09/13/2024 17:14   DG Tibia/Fibula Left Result Date: 09/13/2024 CLINICAL DATA:  Left leg pain after fall yesterday. EXAM: LEFT TIBIA AND FIBULA - 2 VIEW COMPARISON:  None Available. FINDINGS: There is no evidence of fracture or other focal bone  lesions. Soft tissues are unremarkable. IMPRESSION: Negative. Electronically Signed   By: Lynwood Landy Raddle M.D.   On: 09/13/2024 16:38   DG Femur Min 2 Views Left Result Date: 09/13/2024 CLINICAL DATA:  Left leg pain after fall yesterday. EXAM: LEFT FEMUR 2 VIEWS COMPARISON:  None Available. FINDINGS: There is no evidence of fracture or other focal bone lesions. Soft tissues are unremarkable. IMPRESSION: Negative. Electronically Signed   By: Lynwood Landy Raddle M.D.   On: 09/13/2024 16:37   DG Pelvis 1-2 Views Result Date: 09/13/2024 CLINICAL DATA:  Left leg pain after fall yesterday. EXAM: PELVIS - 1-2 VIEW COMPARISON:  None Available. FINDINGS: There is no evidence of pelvic fracture or diastasis. No pelvic bone lesions are seen. IMPRESSION: No acute abnormality seen. Electronically Signed   By: Lynwood Landy Raddle M.D.   On: 09/13/2024 16:36   Labs on Admission: I have personally reviewed following labs  CBC: Recent Labs  Lab 09/13/24 1253  WBC 7.1  HGB 15.2*  HCT 47.6*  MCV 85.3  PLT 101*   Basic Metabolic Panel: Recent Labs  Lab 09/13/24 1253  NA 139  K 4.2  CL 103  CO2 26  GLUCOSE 133*  BUN 17  CREATININE 0.87  CALCIUM 9.2   GFR: Estimated Creatinine Clearance: 30 mL/min (by C-G formula based on SCr of 0.87 mg/dL).  Liver Function Tests: Recent Labs  Lab 09/13/24 1253  AST 22  ALT 14  ALKPHOS 101  BILITOT 1.1  PROT 7.1  ALBUMIN 3.7   Urine analysis:    Component Value Date/Time   COLORURINE YELLOW (A) 09/13/2024 1824   APPEARANCEUR HAZY (A) 09/13/2024 1824   LABSPEC 1.012 09/13/2024 1824   PHURINE 7.0 09/13/2024 1824   GLUCOSEU NEGATIVE 09/13/2024 1824   HGBUR NEGATIVE 09/13/2024 1824   BILIRUBINUR NEGATIVE 09/13/2024 1824   KETONESUR 5 (A) 09/13/2024 1824   PROTEINUR NEGATIVE 09/13/2024 1824   NITRITE NEGATIVE 09/13/2024 1824   LEUKOCYTESUR NEGATIVE 09/13/2024 1824   This document was prepared using Dragon Voice Recognition software and may include  unintentional dictation errors.  Dr. Sherre [facility]  If 7PM-7AM, please contact overnight-coverage provider If 7AM-7PM, please contact day attending provider [email or web address]  09/13/2024, 6:36 PM

## 2024-09-13 NOTE — Assessment & Plan Note (Signed)
 Hydralazine 5 mg IV every 6 hours as needed for SBP greater than 82, 57 days old

## 2024-09-14 ENCOUNTER — Observation Stay

## 2024-09-14 DIAGNOSIS — R531 Weakness: Secondary | ICD-10-CM

## 2024-09-14 DIAGNOSIS — R29818 Other symptoms and signs involving the nervous system: Secondary | ICD-10-CM | POA: Diagnosis not present

## 2024-09-14 LAB — LIPID PANEL
Cholesterol: 137 mg/dL (ref 0–200)
HDL: 38 mg/dL — ABNORMAL LOW (ref >40)
LDL Cholesterol: 83 mg/dL (ref 0–99)
Total CHOL/HDL Ratio: 3.6 ratio
Triglycerides: 80 mg/dL (ref <150)
VLDL: 16 mg/dL (ref 0–40)

## 2024-09-14 LAB — HEMOGLOBIN A1C
Hgb A1c MFr Bld: 5.5 % (ref 4.8–5.6)
Mean Plasma Glucose: 111.15 mg/dL

## 2024-09-14 LAB — GLUCOSE, CAPILLARY: Glucose-Capillary: 94 mg/dL (ref 70–99)

## 2024-09-14 MED ORDER — ENOXAPARIN SODIUM 40 MG/0.4ML IJ SOSY
40.0000 mg | PREFILLED_SYRINGE | INTRAMUSCULAR | Status: DC
Start: 2024-09-14 — End: 2024-09-15
  Administered 2024-09-14: 40 mg via SUBCUTANEOUS
  Filled 2024-09-14: qty 0.4

## 2024-09-14 NOTE — Plan of Care (Signed)

## 2024-09-14 NOTE — Evaluation (Signed)
 Physical Therapy Evaluation Patient Details Name: Erin Browning MRN: [id number] DOB: 1923-09-06 Today's Date: 09/14/2024  History of Present Illness  Pt is a 89 y.o. female presenting after unwitnessed fall at home with LLE weakness & pain. CT and X-rays negative. PMH significant for HTN, HOH, pacemaker.  Clinical Impression  Patient seen for initial PT evaluation due to decline in functional status. Patient is A&O x 4. Baseline mobility reported as modI. Pt daughter in room confirm, currently requiring mod to total assist x 2 for transfers . Gait assessed with RW able to take few steps total assist x 2 with RW. Pt limited by weakness on L side accompanied with generalized weakness. Pt has poor sitting and standing balance with significant forward weight shift; requires physical barrier to prevent fall. Pt lives alone but is agreeable to SNF to get stronger. Clinical impression: patient presents with significant weakness and mobility limitations secondary to fall and L sided leg weakness. Recommend skilled PT to address safety, mobility, and discharge planning. PT recommended SNF       If plan is discharge home, recommend the following: Two people to help with walking and/or transfers;Two people to help with bathing/dressing/bathroom   Can travel by private vehicle   No    Equipment Recommendations    Recommendations for Other Services       Functional Status Assessment Patient has had a recent decline in their functional status and/or demonstrates limited ability to make significant improvements in function in a reasonable and predictable amount of time     Precautions / Restrictions Precautions Precautions: Fall;ICD/Pacemaker Recall of Precautions/Restrictions: Impaired Restrictions Weight Bearing Restrictions Per Provider Order: No      Mobility  Bed Mobility Overal bed mobility: Needs Assistance Bed Mobility: Rolling, Sidelying to Sit Rolling: Mod assist, Used  rails Sidelying to sit: Mod assist, Used rails       General bed mobility comments: slow overall movement speed; vc for hand rail placement hand over hand    Transfers Overall transfer level: Needs assistance Equipment used: Rolling walker (2 wheels) Transfers: Sit to/from Stand Sit to Stand: Mod assist, +2 physical assistance           General transfer comment: visible fatigue after transfer to chair from bed    Ambulation/Gait Ambulation/Gait assistance: Total assist, +2 physical assistance Gait Distance (Feet): 3 Feet Assistive device: Rolling walker (2 wheels) Gait Pattern/deviations: Step-to pattern, Decreased stride length, Decreased dorsiflexion - right, Decreased weight shift to left, Decreased dorsiflexion - left          Stairs            Wheelchair Mobility     Tilt Bed    Modified Rankin (Stroke Patients Only)       Balance Overall balance assessment: Needs assistance Sitting-balance support: Feet supported, Bilateral upper extremity supported Sitting balance-Leahy Scale: Poor Sitting balance - Comments: vc and tc for posterior weight shift; significant forward lean at rest; requires physical brace to not fall forward Postural control: Posterior lean Standing balance support: Bilateral upper extremity supported, Reliant on assistive device for balance Standing balance-Leahy Scale: Poor Standing balance comment: x2 mod to max assist to stand at bed side; near total assist x2 due to fatigue to transfer to chair                             Pertinent Vitals/Pain Pain Assessment Pain Assessment: Faces Pain Score: 6  Faces Pain Scale:  Hurts even more Pain Location: L leg Pain Intervention(s): Repositioned, Monitored during session, Limited activity within patient's tolerance    Home Living Family/patient expects to be discharged to:: Private residence Living Arrangements: Alone Available Help at Discharge: Family (daugther  sitting bedside) Type of Home: House Home Access: Stairs to enter Entrance Stairs-Rails: Can reach both Entrance Stairs-Number of Steps: 3   Home Layout: One level Home Equipment: Agricultural consultant (2 wheels);Cane - quad;Shower seat - built in      Prior Function Prior Level of Function : Independent/Modified Independent                     Extremity/Trunk Assessment        Lower Extremity Assessment Lower Extremity Assessment: Generalized weakness (assistance needed to flex and extend legs in bed)    Cervical / Trunk Assessment Cervical / Trunk Assessment: Normal  Communication   Communication Communication: No apparent difficulties Factors Affecting Communication: Hearing impaired    Cognition Arousal: Alert Behavior During Therapy: WFL for tasks assessed/performed   PT - Cognitive impairments: No apparent impairments                         Following commands: Intact       Cueing Cueing Techniques: Verbal cues, Gestural cues, Tactile cues     General Comments      Exercises     Assessment/Plan    PT Assessment Patient needs continued PT services  PT Problem List Decreased strength;Decreased range of motion;Decreased activity tolerance;Decreased balance;Decreased knowledge of use of DME;Decreased mobility;Pain       PT Treatment Interventions DME instruction;Gait training;Functional mobility training;Therapeutic activities;Balance training;Therapeutic exercise;Neuromuscular re-education;Patient/family education    PT Goals (Current goals can be found in the Care Plan section)  Acute Rehab PT Goals Patient Stated Goal: Pt wants to get stronger. pt agreeable to SNF placement PT Goal Formulation: With patient/family Time For Goal Achievement: 09/28/24 Potential to Achieve Goals: Fair    Frequency Min 2X/week     Co-evaluation PT/OT/SLP Co-Evaluation/Treatment: Yes Reason for Co-Treatment: Complexity of the patient's impairments  (multi-system involvement);For patient/therapist safety PT goals addressed during session: Mobility/safety with mobility;Balance;Strengthening/ROM         AM-PAC PT 6 Clicks Mobility  Outcome Measure Help needed turning from your back to your side while in a flat bed without using bedrails?: A Lot Help needed moving from lying on your back to sitting on the side of a flat bed without using bedrails?: A Lot Help needed moving to and from a bed to a chair (including a wheelchair)?: A Lot Help needed standing up from a chair using your arms (e.g., wheelchair or bedside chair)?: A Lot Help needed to walk in hospital room?: A Lot Help needed climbing 3-5 steps with a railing? : Total 6 Click Score: 11    End of Session Equipment Utilized During Treatment: Gait belt Activity Tolerance: Patient limited by fatigue;Patient limited by pain Patient left: in chair;with family/visitor present;with call bell/phone within reach;with chair alarm set Nurse Communication: Mobility status PT Visit Diagnosis: Unsteadiness on feet (R26.81);Other abnormalities of gait and mobility (R26.89);Difficulty in walking, not elsewhere classified (R26.2);History of falling (Z91.81);Muscle weakness (generalized) (M62.81);Pain Pain - Right/Left: Left Pain - part of body: Leg    Time: 9084-9062 PT Time Calculation (min) (ACUTE ONLY): 22 min   Charges:   PT Evaluation $PT Eval Moderate Complexity: 1 Mod   PT General Charges $$ ACUTE PT VISIT: 1 Visit  Sherlean Lesches DPT, PT    Sherlean DELENA Lesches 09/14/2024, 10:32 AM

## 2024-09-14 NOTE — Plan of Care (Signed)
 Problem: Education: Goal: Knowledge of disease or condition will improve 09/14/2024 1939 by Merilee Izetta SAUNDERS, LPN Outcome: Progressing 09/14/2024 1939 by Merilee, Adriona Kaney R, LPN Outcome: Progressing Goal: Knowledge of secondary prevention will improve (MUST DOCUMENT ALL) 09/14/2024 1939 by Merilee Izetta SAUNDERS, LPN Outcome: Progressing 09/14/2024 1939 by Merilee, Lurlean Kernen R, LPN Outcome: Progressing Goal: Knowledge of patient specific risk factors will improve (DELETE if not current risk factor) 09/14/2024 1939 by Merilee, Teresa Nicodemus R, LPN Outcome: Progressing 09/14/2024 1939 by Merilee, Kenise Barraco R, LPN Outcome: Progressing   Problem: Ischemic Stroke/TIA Tissue Perfusion: Goal: Complications of ischemic stroke/TIA will be minimized 09/14/2024 1939 by Merilee Izetta SAUNDERS, LPN Outcome: Progressing 09/14/2024 1939 by Merilee, Kai Calico R, LPN Outcome: Progressing   Problem: Coping: Goal: Will verbalize positive feelings about self 09/14/2024 1939 by Merilee Izetta SAUNDERS, LPN Outcome: Progressing 09/14/2024 1939 by Merilee, Allessandra Bernardi R, LPN Outcome: Progressing Goal: Will identify appropriate support needs 09/14/2024 1939 by Merilee, Decari Duggar R, LPN Outcome: Progressing 09/14/2024 1939 by Merilee, Briarrose Shor R, LPN Outcome: Progressing   Problem: Health Behavior/Discharge Planning: Goal: Ability to manage health-related needs will improve 09/14/2024 1939 by Merilee, Haleema Vanderheyden R, LPN Outcome: Progressing 09/14/2024 1939 by Merilee, Sheray Grist R, LPN Outcome: Progressing Goal: Goals will be collaboratively established with patient/family 09/14/2024 1939 by Merilee, Indica Marcott R, LPN Outcome: Progressing 09/14/2024 1939 by Merilee, Madisan Bice R, LPN Outcome: Progressing   Problem: Self-Care: Goal: Ability to participate in self-care as condition permits will improve 09/14/2024 1939 by Merilee, Daisie Haft R, LPN Outcome: Progressing 09/14/2024 1939 by Merilee, Archie Atilano R, LPN Outcome: Progressing Goal: Verbalization of feelings and  concerns over difficulty with self-care will improve 09/14/2024 1939 by Merilee, Iori Gigante R, LPN Outcome: Progressing 09/14/2024 1939 by Merilee, Giovanny Dugal R, LPN Outcome: Progressing Goal: Ability to communicate needs accurately will improve 09/14/2024 1939 by Merilee, Jenifer Struve R, LPN Outcome: Progressing 09/14/2024 1939 by Merilee, Awab Abebe R, LPN Outcome: Progressing   Problem: Nutrition: Goal: Risk of aspiration will decrease 09/14/2024 1939 by Merilee Izetta SAUNDERS, LPN Outcome: Progressing 09/14/2024 1939 by Merilee, Wynne Rozak R, LPN Outcome: Progressing Goal: Dietary intake will improve 09/14/2024 1939 by Merilee Izetta SAUNDERS, LPN Outcome: Progressing 09/14/2024 1939 by Merilee, Derl Abalos R, LPN Outcome: Progressing   Problem: Education: Goal: Knowledge of General Education information will improve Description: Including pain rating scale, medication(s)/side effects and non-pharmacologic comfort measures 09/14/2024 1939 by Merilee, Idaly Verret R, LPN Outcome: Progressing 09/14/2024 1939 by Merilee, Selig Wampole R, LPN Outcome: Progressing   Problem: Health Behavior/Discharge Planning: Goal: Ability to manage health-related needs will improve 09/14/2024 1939 by Merilee, Mahamed Zalewski R, LPN Outcome: Progressing 09/14/2024 1939 by Merilee, Janalynn Eder R, LPN Outcome: Progressing   Problem: Clinical Measurements: Goal: Ability to maintain clinical measurements within normal limits will improve 09/14/2024 1939 by Merilee, Porshea Janowski R, LPN Outcome: Progressing 09/14/2024 1939 by Merilee, Elizbeth Posa R, LPN Outcome: Progressing Goal: Will remain free from infection 09/14/2024 1939 by Merilee, Mykenzie Ebanks R, LPN Outcome: Progressing 09/14/2024 1939 by Merilee, Beverly Ferner R, LPN Outcome: Progressing Goal: Diagnostic test results will improve 09/14/2024 1939 by Merilee Izetta SAUNDERS, LPN Outcome: Progressing 09/14/2024 1939 by Merilee, Twila Rappa R, LPN Outcome: Progressing Goal: Respiratory complications will improve 09/14/2024 1939 by Merilee Izetta SAUNDERS, LPN Outcome: Progressing 09/14/2024 1939 by Merilee, Mirza Kidney R, LPN Outcome: Progressing Goal: Cardiovascular complication will be avoided 09/14/2024 1939 by Merilee Izetta SAUNDERS, LPN Outcome: Progressing 09/14/2024 1939 by Merilee Izetta SAUNDERS, LPN Outcome: Progressing   Problem: Activity: Goal: Risk for activity intolerance will decrease 09/14/2024 1939 by Merilee Izetta SAUNDERS, LPN Outcome: Progressing 09/14/2024 1939 by  Merilee, Yunuen Mordan R, LPN Outcome: Progressing   Problem: Nutrition: Goal: Adequate nutrition will be maintained 09/14/2024 1939 by Merilee Izetta SAUNDERS, LPN Outcome: Progressing 09/14/2024 1939 by Merilee, Lori-Ann Lindfors R, LPN Outcome: Progressing   Problem: Coping: Goal: Level of anxiety will decrease 09/14/2024 1939 by Merilee Izetta SAUNDERS, LPN Outcome: Progressing 09/14/2024 1939 by Merilee, Chelli Yerkes R, LPN Outcome: Progressing   Problem: Elimination: Goal: Will not experience complications related to bowel motility 09/14/2024 1939 by Merilee Izetta SAUNDERS, LPN Outcome: Progressing 09/14/2024 1939 by Merilee, Tyshaun Vinzant R, LPN Outcome: Progressing Goal: Will not experience complications related to urinary retention 09/14/2024 1939 by Merilee Izetta SAUNDERS, LPN Outcome: Progressing 09/14/2024 1939 by Merilee, Trellis Guirguis R, LPN Outcome: Progressing   Problem: Pain Managment: Goal: General experience of comfort will improve and/or be controlled 09/14/2024 1939 by Merilee, Averill Pons R, LPN Outcome: Progressing 09/14/2024 1939 by Merilee, Jaleeyah Munce R, LPN Outcome: Progressing   Problem: Safety: Goal: Ability to remain free from injury will improve 09/14/2024 1939 by Merilee, Abbegale Stehle R, LPN Outcome: Progressing 09/14/2024 1939 by Merilee, Monte Zinni R, LPN Outcome: Progressing   Problem: Skin Integrity: Goal: Risk for impaired skin integrity will decrease 09/14/2024 1939 by Merilee Izetta SAUNDERS, LPN Outcome: Progressing 09/14/2024 1939 by Merilee Izetta SAUNDERS, LPN Outcome: Progressing

## 2024-09-14 NOTE — NC FL2 (Signed)
  [place]  MEDICAID FL2 LEVEL OF CARE FORM     IDENTIFICATION  Patient Name: Erin Browning Birthdate: 10-01-1923 Sex: female Admission Date (Current Location): 09/13/2024  [facility] and [place] Number:  Chiropodist and Address:  [facility], [address], [place], [place] [address]      Provider Number: [id number]  Attending Physician Name and Address:  Trudy Anthony HERO, MD  Relative Name and Phone Number:  Bascom Cramp (Granddaughter)  [phone]    Current Level of Care: Hospital Recommended Level of Care: Skilled Nursing Facility Prior Approval Number:    Date Approved/Denied:   PASRR Number:    Discharge Plan: SNF    Current Diagnoses: Patient Active Problem List   Diagnosis Date Noted   Stroke-like symptom 09/13/2024   Essential hypertension 09/13/2024   Thrombocytopenia 09/13/2024   Weakness of left lower extremity 09/13/2024   HOH (hard of hearing) 09/13/2024   Pacemaker 11/16/2023   Complete heart block (HCC) 08/27/2018   Symptomatic bradycardia 08/26/2018    Orientation RESPIRATION BLADDER Height & Weight     Self, Time    External catheter Weight: 63.2 kg Height:  5' 1 (154.9 cm)  BEHAVIORAL SYMPTOMS/MOOD NEUROLOGICAL BOWEL NUTRITION STATUS      Incontinent Diet (Heart)  AMBULATORY STATUS COMMUNICATION OF NEEDS Skin   Limited Assist                           Personal Care Assistance Level of Assistance  Bathing, Feeding, Dressing Bathing Assistance: Limited assistance Feeding assistance: Limited assistance Dressing Assistance: Limited assistance     Functional Limitations Info             SPECIAL CARE FACTORS FREQUENCY  OT (By licensed OT), PT (By licensed PT)     PT Frequency: 5 X week OT Frequency: 5 x week            Contractures      Additional Factors Info  Code Status, Allergies Code Status Info: DNR Allergies Info: NKA           Current Medications (09/14/2024):  This  is the current hospital active medication list Current Facility-Administered Medications  Medication Dose Route Frequency Provider Last Rate Last Admin   acetaminophen  (TYLENOL ) tablet 650 mg  650 mg Oral Q4H PRN Cox, Amy N, DO       Or   acetaminophen  (TYLENOL ) 160 MG/5ML solution 650 mg  650 mg Per Tube Q4H PRN Cox, Amy N, DO       Or   acetaminophen  (TYLENOL ) suppository 650 mg  650 mg Rectal Q4H PRN Cox, Amy N, DO       enoxaparin (LOVENOX) injection 40 mg  40 mg Subcutaneous Q24H Trudy Anthony M, MD       hydrALAZINE (APRESOLINE) injection 5 mg  5 mg Intravenous Q6H PRN Cox, Amy N, DO       oxyCODONE  (Oxy IR/ROXICODONE ) immediate release tablet 5 mg  5 mg Oral Q6H PRN Cox, Amy N, DO       senna-docusate (Senokot-S) tablet 1 tablet  1 tablet Oral QHS PRN Cox, Amy N, DO         Discharge Medications: Please see discharge summary for a list of discharge medications.  Relevant Imaging Results:  Relevant Lab Results:   Additional Information SSN    [id number]  Erin GORMAN Fuse, RN

## 2024-09-14 NOTE — TOC Initial Note (Addendum)
 Transition of Care [facility]) - Initial/Assessment Note    Patient Details  Name: Erin Browning MRN: [id number] Date of Birth: 09-20-1923  [facility] CM/SW Contact:    Erin GORMAN Fuse, RN Phone Number: 09/14/2024, 3:36 PM  Clinical Narrative:                 Therapy recommends SNF. TOC met with the patient in the room and offered choice. The patient had a difficult time understanding TOC due to hearing aid limitations. The patient gave [facility] permission to speak with her daughter Erin Browning and granddaughter Erin Browning. TOC lvmm for Erin Browning requesting callback to discuss SNF preference. TOC placed call to the patient's daughter Erin Browning, she didn't have a preference for SNF for STR.  TOC sent FL2 out in [place] Bed offers are pending.  Expected Discharge Plan: Skilled Nursing Facility Barriers to Discharge: No SNF bed   Patient Goals and CMS Choice     Choice offered to / list presented to : Patient, Adult Children      Expected Discharge Plan and Services     Post Acute Care Choice: Skilled Nursing Facility                                        Prior Living Arrangements/Services                       Activities of Daily Living      Permission Sought/Granted                  Emotional Assessment              Admission diagnosis:  Stroke-like symptom [R29.90] Patient Active Problem List   Diagnosis Date Noted   Stroke-like symptom 09/13/2024   Essential hypertension 09/13/2024   Thrombocytopenia 09/13/2024   Weakness of left lower extremity 09/13/2024   HOH (hard of hearing) 09/13/2024   Pacemaker 11/16/2023   Complete heart block (HCC) 08/27/2018   Symptomatic bradycardia 08/26/2018   PCP:  Cleotilde Oneil FALCON, MD Pharmacy:   [facility] [phone] [address] [place], [place] - [address] AT [facility] OF [address] [place] [address] [place] [address] Phone: [phone] Fax: [phone]     Social Drivers of Health  (SDOH) Social History: SDOH Screenings   Food Insecurity: No Food Insecurity (09/14/2024)  Housing: Low Risk  (09/14/2024)  Transportation Needs: No Transportation Needs (09/13/2024)  Utilities: Not At Risk (09/13/2024)  Financial Resource Strain: Low Risk  (06/01/2024)   Received from [facility]  Social Connections: Moderately Isolated (09/13/2024)  Tobacco Use: Medium Risk (09/13/2024)   SDOH Interventions:     Readmission Risk Interventions     No data to display

## 2024-09-14 NOTE — Progress Notes (Addendum)
 PROGRESS NOTE    Erin Browning  FMW:[id number] DOB: 1923-06-08 DOA: 09/13/2024 PCP: Cleotilde Oneil FALCON, MD    Assessment & Plan:   Principal Problem:   Stroke-like symptom Active Problems:   Complete heart block (HCC)   Pacemaker   Essential hypertension   Thrombocytopenia   Weakness of left lower extremity   HOH (hard of hearing)  Assessment and Plan: Acute b/l LE weakness: persistent. CT head x 2 neg for any acute intracranial abnormalities. C/o b/l leg pain & weakness. PT/OT recs SNF   HTN: BP is currently WNL. Holding home dose of triamterene -hydrochlorothiazide   Thrombocytopenia: etiology unclear. Will continue to monitor   Complete heart block: s/p pacemaker placement 12 years ago. No acute issues        DVT prophylaxis: lovenox Code Status: DNR Family Communication: discussed pt's care w/ pt's daughter, Darice and answered her questions Disposition Plan: likely d/c to SNF  Level of care: Telemetry Medical  Status is: Observation The patient remains OBS appropriate and will d/c before 2 midnights.    Consultants:    Procedures:   Antimicrobials:    Subjective: Pt c/o b/l leg weakness & pain  Objective: Vitals:   09/13/24 1947 09/13/24 2347 09/14/24 0346 09/14/24 0747  BP: (!) 147/70 121/62 124/81 (!) 106/54  Pulse: 88 93 (!) 105   Resp:    18  Temp: 97.7 F (36.5 C) 97.9 F (36.6 C) 97.9 F (36.6 C) 98 F (36.7 C)  TempSrc: Oral Oral Oral   SpO2: 96% 93% 93% 93%  Weight:      Height:       No intake or output data in the 24 hours ending 09/14/24 0950 Filed Weights   09/13/24 1249  Weight: 63.2 kg    Examination:  General exam: Appears calm and comfortable  Respiratory system: Clear to auscultation. Respiratory effort normal. Cardiovascular system: S1 & S2+. No  rubs, gallops or clicks. Gastrointestinal system: Abdomen is nondistended, soft and nontender.Normal bowel sounds heard. Central nervous system: Alert and oriented. Moves  all extremities Psychiatry: Judgement and insight appear normal. Mood & affect appropriate.     Data Reviewed: I have personally reviewed following labs and imaging studies  CBC: Recent Labs  Lab 09/13/24 1253  WBC 7.1  HGB 15.2*  HCT 47.6*  MCV 85.3  PLT 101*   Basic Metabolic Panel: Recent Labs  Lab 09/13/24 1253  NA 139  K 4.2  CL 103  CO2 26  GLUCOSE 133*  BUN 17  CREATININE 0.87  CALCIUM 9.2   GFR: Estimated Creatinine Clearance: 30 mL/min (by C-G formula based on SCr of 0.87 mg/dL). Liver Function Tests: Recent Labs  Lab 09/13/24 1253  AST 22  ALT 14  ALKPHOS 101  BILITOT 1.1  PROT 7.1  ALBUMIN 3.7   No results for input(s): LIPASE, AMYLASE in the last 168 hours. No results for input(s): AMMONIA in the last 168 hours. Coagulation Profile: No results for input(s): INR, PROTIME in the last 168 hours. Cardiac Enzymes: No results for input(s): CKTOTAL, CKMB, CKMBINDEX, TROPONINI in the last 168 hours. BNP (last 3 results) No results for input(s): PROBNP in the last 8760 hours. HbA1C: Recent Labs    09/14/24 0404  HGBA1C 5.5   CBG: Recent Labs  Lab 09/14/24 0748  GLUCAP 94   Lipid Profile: Recent Labs    09/14/24 0404  CHOL 137  HDL 38*  LDLCALC 83  TRIG 80  CHOLHDL 3.6   Thyroid  Function Tests: No results  for input(s): TSH, T4TOTAL, FREET4, T3FREE, THYROIDAB in the last 72 hours. Anemia Panel: No results for input(s): VITAMINB12, FOLATE, FERRITIN, TIBC, IRON, RETICCTPCT in the last 72 hours. Sepsis Labs: No results for input(s): PROCALCITON, LATICACIDVEN in the last 168 hours.  No results found for this or any previous visit (from the past 240 hours).       Radiology Studies: CT HEAD WO CONTRAST ( ) Result Date: 09/14/2024 CLINICAL DATA:  89 year old female with fall, left side weakness. EXAM: CT HEAD WITHOUT CONTRAST TECHNIQUE: Contiguous axial images were obtained from the base  of the skull through the vertex without intravenous contrast. RADIATION DOSE REDUCTION: This exam was performed according to the departmental dose-optimization program which includes automated exposure control, adjustment of the mA and/or kV according to patient size and/or use of iterative reconstruction technique. COMPARISON:  Head CT yesterday. FINDINGS: Brain: Cerebral volume is within normal limits for age. No midline shift, ventriculomegaly, mass effect, evidence of mass lesion, intracranial hemorrhage or evidence of cortically based acute infarction. Small but circumscribed hypodensity at the anterior left external capsule, bordering the anterior basal ganglia appears to be chronic small vessel disease and stable (series 2, image 19, see series 4 image 26). Elsewhere gray-white differentiation is normal for age. Vascular: Calcified atherosclerosis at the skull base. No suspicious intracranial vascular hyperdensity. Skull: Appears intact. Osteopenia. No acute osseous abnormality identified. Sinuses/Orbits: Visualized paranasal sinuses and mastoids are stable and well aerated. Other: No acute orbit or scalp soft tissue injury identified. IMPRESSION: 1. No acute intracranial abnormality or acute traumatic injury identified. 2. Mild for age chronic appearing small vessel disease, left basal ganglia/external capsule. Electronically Signed   By: VEAR Hurst M.D.   On: 09/14/2024 07:44   CT Head Wo Contrast Result Date: 09/13/2024 CLINICAL DATA:  Status post fall. EXAM: CT HEAD WITHOUT CONTRAST TECHNIQUE: Contiguous axial images were obtained from the base of the skull through the vertex without intravenous contrast. RADIATION DOSE REDUCTION: This exam was performed according to the departmental dose-optimization program which includes automated exposure control, adjustment of the mA and/or kV according to patient size and/or use of iterative reconstruction technique. COMPARISON:  None Available. FINDINGS: Brain:  There is generalized cerebral atrophy with widening of the extra-axial spaces and ventricular dilatation. There are areas of decreased attenuation within the white matter tracts of the supratentorial brain, consistent with microvascular disease changes. A small chronic left anterior external capsule white matter infarct is seen. Vascular: No hyperdense vessel or unexpected calcification. Skull: Normal. Negative for fracture or focal lesion. Sinuses/Orbits: No acute finding. Other: None. IMPRESSION: 1. Generalized cerebral atrophy and microvascular disease changes of the supratentorial brain. 2. 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 3. No acute intracranial abnormality. Electronically Signed   By: Suzen Dials M.D.   On: 09/13/2024 17:14   DG Tibia/Fibula Left Result Date: 09/13/2024 CLINICAL DATA:  Left leg pain after fall yesterday. EXAM: LEFT TIBIA AND FIBULA - 2 VIEW COMPARISON:  None Available. FINDINGS: There is no evidence of fracture or other focal bone lesions. Soft tissues are unremarkable. IMPRESSION: Negative. Electronically Signed   By: Lynwood Landy Raddle M.D.   On: 09/13/2024 16:38   DG Femur Min 2 Views Left Result Date: 09/13/2024 CLINICAL DATA:  Left leg pain after fall yesterday. EXAM: LEFT FEMUR 2 VIEWS COMPARISON:  None Available. FINDINGS: There is no evidence of fracture or other focal bone lesions. Soft tissues are unremarkable. IMPRESSION: Negative. Electronically Signed   By: Lynwood Landy  Jr M.D.   On: 09/13/2024 16:37   DG Pelvis 1-2 Views Result Date: 09/13/2024 CLINICAL DATA:  Left leg pain after fall yesterday. EXAM: PELVIS - 1-2 VIEW COMPARISON:  None Available. FINDINGS: There is no evidence of pelvic fracture or diastasis. No pelvic bone lesions are seen. IMPRESSION: No acute abnormality seen. Electronically Signed   By: Lynwood Landy Raddle M.D.   On: 09/13/2024 16:36        Scheduled Meds: Continuous Infusions:   LOS: 0 days       Anthony CHRISTELLA Pouch, MD [facility] Pager [phone]  If 7PM-7AM, please contact night-coverage [email or web address] 09/14/2024, 9:50 AM

## 2024-09-14 NOTE — Evaluation (Signed)
 Occupational Therapy Evaluation Patient Details Name: Erin Browning MRN: [id number] DOB: October 28, 1923 Today's Date: 09/14/2024   History of Present Illness   Pt is a 89 y.o. female presenting after unwitnessed fall at home with LLE weakness & pain. CT and X-rays negative. PMH significant for HTN, HOH, pacemaker.     Clinical Impressions Pt admitted with above. Prior to admission, pt lives alone with daughter nearby to assist intermittently and was able to perform ADLs/mobility with RW. Pt requires MOD A for bed mobility, MOD A +2 using RW for STS transfers, difficulties with upright posture, relies heavily on UE support on RW and maintains forward trunk flexion despite multimodal cuing. MAX A for UB dressing seated at EOB (question HOH impacting ability to process and follow commands vs cognition; pt wears hearing aides currently being charged), MAX A for pericare in standing after incontinent urine void. Pt is able to perform step pivot transfer from bed to recliner with MOD - MAX A +2, difficulties advancing LLE and slow, shuffled steps noted. Pt would benefit from skilled OT services to address noted impairments and functional limitations (see below for any additional details) in order to maximize safety and independence while minimizing falls risk and caregiver burden. Anticipate the need for follow up OT services upon acute hospital DC. Patient will benefit from continued inpatient follow up therapy, <3 hours/day      If plan is discharge home, recommend the following:   Two people to help with walking and/or transfers;A lot of help with bathing/dressing/bathroom;Assistance with cooking/housework;Direct supervision/assist for medications management;Direct supervision/assist for financial management;Assist for transportation;Help with stairs or ramp for entrance;Supervision due to cognitive status     Functional Status Assessment   Patient has had a recent decline in their functional  status and demonstrates the ability to make significant improvements in function in a reasonable and predictable amount of time.     Equipment Recommendations   None recommended by OT     Recommendations for Other Services         Precautions/Restrictions   Precautions Precautions: Fall;ICD/Pacemaker Recall of Precautions/Restrictions: Impaired Restrictions Weight Bearing Restrictions Per Provider Order: No     Mobility Bed Mobility Overal bed mobility: Needs Assistance Bed Mobility: Rolling, Sidelying to Sit Rolling: Mod assist, Used rails Sidelying to sit: Mod assist, Used rails       General bed mobility comments: poor motor planning    Transfers Overall transfer level: Needs assistance Equipment used: Rolling walker (2 wheels) Transfers: Sit to/from Stand, Bed to chair/wheelchair/BSC Sit to Stand: Mod assist, +2 physical assistance     Step pivot transfers: Mod assist, +2 physical assistance     General transfer comment: cues to push up from surface with poor carryover; forward flexed posture requiring max multimodal cues to correct, heavy UE reliance on RW. step by step cues to take pivotal steps to recliner, difficulties advancing LLE with slow, shuffled steps      Balance Overall balance assessment: Needs assistance Sitting-balance support: Feet supported, Bilateral upper extremity supported Sitting balance-Leahy Scale: Poor   Postural control: Posterior lean Standing balance support: Bilateral upper extremity supported, Reliant on assistive device for balance Standing balance-Leahy Scale: Poor                             ADL either performed or assessed with clinical judgement   ADL Overall ADL's : Needs assistance/impaired     Grooming: Wash/dry face;Sitting;Set up  Upper Body Dressing : Maximal assistance;Sitting Upper Body Dressing Details (indicate cue type and reason): doff housecoat/nightshirt, don gown. maxA  with multimodal cuing (question HOH impacting ability to follow commands, hearing aides charging). Lower Body Dressing: Maximal assistance;Sit to/from stand;Sitting/lateral leans   Toilet Transfer: Moderate assistance;Cueing for safety;Cueing for sequencing;BSC/3in1;Rolling walker (2 wheels) Toilet Transfer Details (indicate cue type and reason): difficulties advancing LLE; cues for AD proximity and requires max facilitation of AD during step pivot transfer to recliner. pt with forward lean at trunk, difficulties correcting even with cues Toileting- Clothing Manipulation and Hygiene: Maximal assistance;Sitting/lateral lean;Sit to/from stand Toileting - Clothing Manipulation Details (indicate cue type and reason): maxA for pericare, pt incontient of void in standing     Functional mobility during ADLs: +2 for physical assistance;Moderate assistance       Vision Baseline Vision/History: 1 Wears glasses Ability to See in Adequate Light: 0 Adequate Patient Visual Report: No change from baseline              Pertinent Vitals/Pain Pain Assessment Pain Assessment: Faces Faces Pain Scale: Hurts even more Pain Location: L leg Pain Descriptors / Indicators: Aching, Discomfort, Grimacing Pain Intervention(s): Repositioned, Limited activity within patient's tolerance, Monitored during session     Extremity/Trunk Assessment Upper Extremity Assessment Upper Extremity Assessment: Generalized weakness;Right hand dominant   Lower Extremity Assessment Lower Extremity Assessment: Defer to PT evaluation (difficulties motor planning with LLE vs RLE)   Cervical / Trunk Assessment Cervical / Trunk Assessment: Normal   Communication Communication Communication: Impaired Factors Affecting Communication: Hearing impaired   Cognition Arousal: Alert Behavior During Therapy: WFL for tasks assessed/performed Cognition: Cognition impaired, Difficult to assess Difficult to assess due to: Hard of  hearing/deaf   Awareness: Intellectual awareness impaired     Executive functioning impairment (select all impairments): Problem solving, Reasoning                   Following commands: Intact       Cueing  General Comments   Cueing Techniques: Verbal cues;Gestural cues;Tactile cues  Daughter present during session           Home Living Family/patient expects to be discharged to:: Private residence Living Arrangements: Alone Available Help at Discharge: Family Type of Home: House Home Access: Stairs to enter Secretary/administrator of Steps: 3 Entrance Stairs-Rails: Can reach both Home Layout: One level     Bathroom Shower/Tub: Chief Strategy Officer: Standard Bathroom Accessibility: Yes   Home Equipment: Agricultural consultant (2 wheels);Cane - quad;Shower seat - built in   Additional Comments: daughter stops in to assist      Prior Functioning/Environment Prior Level of Function : Independent/Modified Independent             Mobility Comments: uses RW ADLs Comments: mod independent    OT Problem List: Decreased strength;Decreased range of motion;Decreased activity tolerance;Impaired balance (sitting and/or standing);Decreased coordination;Decreased cognition;Decreased safety awareness;Decreased knowledge of use of DME or AE   OT Treatment/Interventions: Self-care/ADL training;Therapeutic exercise;Neuromuscular education;Energy conservation;DME and/or AE instruction;Therapeutic activities;Patient/family education;Balance training      OT Goals(Current goals can be found in the care plan section)   Acute Rehab OT Goals OT Goal Formulation: With patient/family Time For Goal Achievement: 09/28/24 Potential to Achieve Goals: Fair   OT Frequency:  Min 2X/week    Co-evaluation PT/OT/SLP Co-Evaluation/Treatment: Yes Reason for Co-Treatment: Complexity of the patient's impairments (multi-system involvement);For patient/therapist safety PT  goals addressed during session: Mobility/safety with mobility;Balance;Strengthening/ROM OT goals addressed during  session: ADL's and self-care      AM-PAC OT 6 Clicks Daily Activity     Outcome Measure Help from another person eating meals?: None Help from another person taking care of personal grooming?: A Little Help from another person toileting, which includes using toliet, bedpan, or urinal?: A Lot Help from another person bathing (including washing, rinsing, drying)?: A Lot Help from another person to put on and taking off regular upper body clothing?: A Lot Help from another person to put on and taking off regular lower body clothing?: A Lot 6 Click Score: 15   End of Session Equipment Utilized During Treatment: Rolling walker (2 wheels);Gait belt Nurse Communication: Mobility status  Activity Tolerance: Patient tolerated treatment well Patient left: in chair;with call bell/phone within reach;with family/visitor present;with chair alarm set  OT Visit Diagnosis: Unsteadiness on feet (R26.81);Other abnormalities of gait and mobility (R26.89);History of falling (Z91.81);Muscle weakness (generalized) (M62.81)                Time: 9088-9063 OT Time Calculation (min): 25 min Charges:  OT General Charges $OT Visit: 1 Visit OT Evaluation $OT Eval Moderate Complexity: 1 Mod Fey Coghill L. Garnell Begeman, OTR/L  09/14/24, 11:46 AM

## 2024-09-15 DIAGNOSIS — R531 Weakness: Secondary | ICD-10-CM | POA: Diagnosis not present

## 2024-09-15 DIAGNOSIS — R29898 Other symptoms and signs involving the musculoskeletal system: Secondary | ICD-10-CM | POA: Diagnosis not present

## 2024-09-15 LAB — CBC
HCT: 42.9 % (ref 36.0–46.0)
Hemoglobin: 13.9 g/dL (ref 12.0–15.0)
MCH: 27.3 pg (ref 26.0–34.0)
MCHC: 32.4 g/dL (ref 30.0–36.0)
MCV: 84.3 fL (ref 80.0–100.0)
Platelets: 94 K/uL — ABNORMAL LOW (ref 150–400)
RBC: 5.09 MIL/uL (ref 3.87–5.11)
RDW: 13.3 % (ref 11.5–15.5)
WBC: 8.2 K/uL (ref 4.0–10.5)
nRBC: 0 % (ref 0.0–0.2)

## 2024-09-15 LAB — BASIC METABOLIC PANEL WITH GFR
Anion gap: 11 (ref 5–15)
BUN: 21 mg/dL (ref 8–23)
CO2: 25 mmol/L (ref 22–32)
Calcium: 8.6 mg/dL — ABNORMAL LOW (ref 8.9–10.3)
Chloride: 102 mmol/L (ref 98–111)
Creatinine, Ser: 1 mg/dL (ref 0.44–1.00)
GFR, Estimated: 51 mL/min — ABNORMAL LOW (ref >60)
Glucose, Bld: 123 mg/dL — ABNORMAL HIGH (ref 70–99)
Potassium: 3.7 mmol/L (ref 3.5–5.1)
Sodium: 138 mmol/L (ref 135–145)

## 2024-09-15 MED ORDER — MORPHINE SULFATE (PF) 2 MG/ML IV SOLN: 1.0000 mg | INTRAVENOUS | Status: DC | PRN

## 2024-09-15 MED ORDER — ONDANSETRON HCL 4 MG/2ML IJ SOLN
4.0000 mg | Freq: Four times a day (QID) | INTRAMUSCULAR | Status: DC | PRN
  Filled 2024-09-15: qty 2

## 2024-09-15 MED ORDER — ENOXAPARIN SODIUM 30 MG/0.3ML IJ SOSY
30.0000 mg | PREFILLED_SYRINGE | INTRAMUSCULAR | Status: DC
  Administered 2024-09-15 – 2024-09-18 (×4): 30 mg via SUBCUTANEOUS
  Filled 2024-09-15 (×4): qty 0.3

## 2024-09-15 MED ORDER — OXYCODONE-ACETAMINOPHEN 5-325 MG PO TABS
1.0000 | ORAL_TABLET | Freq: Four times a day (QID) | ORAL | Status: DC | PRN
  Administered 2024-09-15 – 2024-09-19 (×2): 1 via ORAL
  Filled 2024-09-15 (×2): qty 1

## 2024-09-15 NOTE — Progress Notes (Signed)
 PHARMACIST - PHYSICIAN COMMUNICATION  CONCERNING:  Enoxaparin (Lovenox) for DVT Prophylaxis    RECOMMENDATION: Patient was prescribed enoxaprin 40mg  q24 hours for VTE prophylaxis.   Filed Weights   09/13/24 1249  Weight: 63.2 kg (139 lb 5.3 oz)    Body mass index is 26.33 kg/m.  Estimated Creatinine Clearance: 26.1 mL/min (by C-G formula based on SCr of 1 mg/dL).   Patient is candidate for enoxaparin 30mg  every 24 hours based on CrCl <57ml/min   DESCRIPTION: Pharmacy has adjusted enoxaparin dose per [facility] policy.  Patient is now receiving enoxaparin 30 mg every 24 hours    Estill CHRISTELLA Lutes, PharmD Clinical Pharmacist  09/15/2024 8:40 AM

## 2024-09-15 NOTE — Plan of Care (Signed)

## 2024-09-15 NOTE — Progress Notes (Signed)
 [facility] Room [facility] Liaison Note  Received a referral from Zenobia Frazier, RN, for palliative care follow up at [facility] at discharge.    Referral submitted today.    Please call with any palliative care  related questions or concerns.  Thank you for the opportunity to participate in this patient's care.  [facility] Liaison [phone]

## 2024-09-15 NOTE — Care Management Obs Status (Signed)
 MEDICARE OBSERVATION STATUS NOTIFICATION   Patient Details  Name: Erin Browning MRN: [id number] Date of Birth: Nov 12, 1923   Medicare Observation Status Notification Given:  Yes    Tayvin Preslar W, CMA 09/15/2024, 11:03 AM

## 2024-09-15 NOTE — TOC Progression Note (Signed)
 Transition of Care [facility]) - Progression Note    Patient Details  Name: Erin Browning MRN: [id number] Date of Birth: 1923-05-23  Transition of [facility]) CM/SW Contact  [name], RN Phone Number: 09/15/2024, 3:22 PM  Clinical Narrative:    TOC spoke with the patient, her daughter, Darice, and granddaughter Monica, in the patient's room and advised [name] [place] has offered a bed. They are in agreement with starting insurance auth. TOC lvmm with Tammy at [facility] to request ins auth. TOC left message for [name] at [facility] to make her aware she accepted.   Family requested that palliative care follow the patient while she is at [facility]. TOC sent referral to [facility].   Expected Discharge Plan: Skilled Nursing Facility Barriers to Discharge: No SNF bed               Expected Discharge Plan and Services     Post Acute Care Choice: Skilled Nursing Facility                                         Social Drivers of Health (SDOH) Interventions SDOH Screenings   Food Insecurity: No Food Insecurity (09/14/2024)  Housing: Low Risk  (09/14/2024)  Transportation Needs: No Transportation Needs (09/13/2024)  Utilities: Not At Risk (09/13/2024)  Financial Resource Strain: Low Risk  (06/01/2024)   Received from [facility]  Social Connections: Moderately Isolated (09/13/2024)  Tobacco Use: Medium Risk (09/13/2024)    Readmission Risk Interventions     No data to display

## 2024-09-15 NOTE — Progress Notes (Signed)
 PROGRESS NOTE    Erin Browning  FMW:[id number] DOB: June 06, 1923 DOA: 09/13/2024 PCP: Cleotilde Oneil FALCON, MD    Assessment & Plan:   Principal Problem:   Stroke-like symptom Active Problems:   Complete heart block (HCC)   Pacemaker   Essential hypertension   Thrombocytopenia   Weakness of left lower extremity   HOH (hard of hearing)  Assessment and Plan: Acute b/l LE weakness: persistent. CT head x 2 neg for any acute intracranial abnormalities. C/o b/l leg pain & weakness. PT/OT recs SNF. Tylenol , percocet prn for pain    HTN: BP is still currently WNL. Holding home dose of triameterene-HCTZ  Thrombocytopenia: etiology unclear. Trending down. Will continue to monitor   Complete heart block: s/p pacemaker placement 12 years ago. No acute issues        DVT prophylaxis: lovenox Code Status: DNR Family Communication: discussed pt's care w/ pt's daughter, Darice and answered her questions Disposition Plan: likely d/c to SNF  Level of care: Telemetry Medical  Status is: Observation The patient remains OBS appropriate and will d/c before 2 midnights. Waiting on SNF placement and CM is working on this    Consultants:    Procedures:   Antimicrobials:    Subjective: Pt c/o b/l leg pain  Objective: Vitals:   09/14/24 1957 09/15/24 0009 09/15/24 0513 09/15/24 0748  BP: 113/71 122/71 107/63 107/65  Pulse: (!) 102 97 (!) 103 (!) 104  Resp: 18 19 18 16   Temp: 98.8 F (37.1 C) 98.3 F (36.8 C) 97.9 F (36.6 C) 97.8 F (36.6 C)  TempSrc: Oral Oral Oral Oral  SpO2: 95% 94% 95% 95%  Weight:      Height:        Intake/Output Summary (Last 24 hours) at 09/15/2024 0800 Last data filed at 09/14/2024 1300 Gross per 24 hour  Intake 420 ml  Output --  Net 420 ml   Filed Weights   09/13/24 1249  Weight: 63.2 kg    Examination:  General exam: Appears calm but uncomfortable, Respiratory system: Clear to auscultation. Respiratory effort normal. Cardiovascular  system: S1 & S2+. No  rubs, gallops or clicks. Gastrointestinal system: Abdomen is nondistended, soft and nontender.Normal bowel sounds heard. Central nervous system: Alert and oriented.  Psychiatry: Judgement and insight appear normal. Flat mood and affect    Data Reviewed: I have personally reviewed following labs and imaging studies  CBC: Recent Labs  Lab 09/13/24 1253 09/15/24 0313  WBC 7.1 8.2  HGB 15.2* 13.9  HCT 47.6* 42.9  MCV 85.3 84.3  PLT 101* 94*   Basic Metabolic Panel: Recent Labs  Lab 09/13/24 1253 09/15/24 0313  NA 139 138  K 4.2 3.7  CL 103 102  CO2 26 25  GLUCOSE 133* 123*  BUN 17 21  CREATININE 0.87 1.00  CALCIUM 9.2 8.6*   GFR: Estimated Creatinine Clearance: 26.1 mL/min (by C-G formula based on SCr of 1 mg/dL). Liver Function Tests: Recent Labs  Lab 09/13/24 1253  AST 22  ALT 14  ALKPHOS 101  BILITOT 1.1  PROT 7.1  ALBUMIN 3.7   No results for input(s): LIPASE, AMYLASE in the last 168 hours. No results for input(s): AMMONIA in the last 168 hours. Coagulation Profile: No results for input(s): INR, PROTIME in the last 168 hours. Cardiac Enzymes: No results for input(s): CKTOTAL, CKMB, CKMBINDEX, TROPONINI in the last 168 hours. BNP (last 3 results) No results for input(s): PROBNP in the last 8760 hours. HbA1C: Recent Labs  09/14/24 0404  HGBA1C 5.5   CBG: Recent Labs  Lab 09/14/24 0748  GLUCAP 94   Lipid Profile: Recent Labs    09/14/24 0404  CHOL 137  HDL 38*  LDLCALC 83  TRIG 80  CHOLHDL 3.6   Thyroid  Function Tests: No results for input(s): TSH, T4TOTAL, FREET4, T3FREE, THYROIDAB in the last 72 hours. Anemia Panel: No results for input(s): VITAMINB12, FOLATE, FERRITIN, TIBC, IRON, RETICCTPCT in the last 72 hours. Sepsis Labs: No results for input(s): PROCALCITON, LATICACIDVEN in the last 168 hours.  No results found for this or any previous visit (from the past 240  hours).       Radiology Studies: CT HEAD WO CONTRAST ( ) Result Date: 09/14/2024 CLINICAL DATA:  89 year old female with fall, left side weakness. EXAM: CT HEAD WITHOUT CONTRAST TECHNIQUE: Contiguous axial images were obtained from the base of the skull through the vertex without intravenous contrast. RADIATION DOSE REDUCTION: This exam was performed according to the departmental dose-optimization program which includes automated exposure control, adjustment of the mA and/or kV according to patient size and/or use of iterative reconstruction technique. COMPARISON:  Head CT yesterday. FINDINGS: Brain: Cerebral volume is within normal limits for age. No midline shift, ventriculomegaly, mass effect, evidence of mass lesion, intracranial hemorrhage or evidence of cortically based acute infarction. Small but circumscribed hypodensity at the anterior left external capsule, bordering the anterior basal ganglia appears to be chronic small vessel disease and stable (series 2, image 19, see series 4 image 26). Elsewhere gray-white differentiation is normal for age. Vascular: Calcified atherosclerosis at the skull base. No suspicious intracranial vascular hyperdensity. Skull: Appears intact. Osteopenia. No acute osseous abnormality identified. Sinuses/Orbits: Visualized paranasal sinuses and mastoids are stable and well aerated. Other: No acute orbit or scalp soft tissue injury identified. IMPRESSION: 1. No acute intracranial abnormality or acute traumatic injury identified. 2. Mild for age chronic appearing small vessel disease, left basal ganglia/external capsule. Electronically Signed   By: VEAR Hurst M.D.   On: 09/14/2024 07:44   CT Head Wo Contrast Result Date: 09/13/2024 CLINICAL DATA:  Status post fall. EXAM: CT HEAD WITHOUT CONTRAST TECHNIQUE: Contiguous axial images were obtained from the base of the skull through the vertex without intravenous contrast. RADIATION DOSE REDUCTION: This exam was performed  according to the departmental dose-optimization program which includes automated exposure control, adjustment of the mA and/or kV according to patient size and/or use of iterative reconstruction technique. COMPARISON:  None Available. FINDINGS: Brain: There is generalized cerebral atrophy with widening of the extra-axial spaces and ventricular dilatation. There are areas of decreased attenuation within the white matter tracts of the supratentorial brain, consistent with microvascular disease changes. A small chronic left anterior external capsule white matter infarct is seen. Vascular: No hyperdense vessel or unexpected calcification. Skull: Normal. Negative for fracture or focal lesion. Sinuses/Orbits: No acute finding. Other: None. IMPRESSION: 1. Generalized cerebral atrophy and microvascular disease changes of the supratentorial brain. 2. 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 3. No acute intracranial abnormality. Electronically Signed   By: Suzen Dials M.D.   On: 09/13/2024 17:14   DG Tibia/Fibula Left Result Date: 09/13/2024 CLINICAL DATA:  Left leg pain after fall yesterday. EXAM: LEFT TIBIA AND FIBULA - 2 VIEW COMPARISON:  None Available. FINDINGS: There is no evidence of fracture or other focal bone lesions. Soft tissues are unremarkable. IMPRESSION: Negative. Electronically Signed   By: Lynwood Landy Raddle M.D.   On: 09/13/2024 16:38   DG Femur Min  2 Views Left Result Date: 09/13/2024 CLINICAL DATA:  Left leg pain after fall yesterday. EXAM: LEFT FEMUR 2 VIEWS COMPARISON:  None Available. FINDINGS: There is no evidence of fracture or other focal bone lesions. Soft tissues are unremarkable. IMPRESSION: Negative. Electronically Signed   By: Lynwood Landy Raddle M.D.   On: 09/13/2024 16:37   DG Pelvis 1-2 Views Result Date: 09/13/2024 CLINICAL DATA:  Left leg pain after fall yesterday. EXAM: PELVIS - 1-2 VIEW COMPARISON:  None Available. FINDINGS: There is no evidence of  pelvic fracture or diastasis. No pelvic bone lesions are seen. IMPRESSION: No acute abnormality seen. Electronically Signed   By: Lynwood Landy Raddle M.D.   On: 09/13/2024 16:36        Scheduled Meds:  enoxaparin (LOVENOX) injection  40 mg Subcutaneous Q24H   Continuous Infusions:   LOS: 0 days      Anthony CHRISTELLA Pouch, MD [facility] Pager [phone]  If 7PM-7AM, please contact night-coverage [email or web address] 09/15/2024, 8:00 AM

## 2024-09-15 NOTE — Plan of Care (Signed)
  Problem: Education: Goal: Knowledge of disease or condition will improve Outcome: Progressing   Problem: Education: Goal: Knowledge of secondary prevention will improve (MUST DOCUMENT ALL) Outcome: Progressing   Problem: Education: Goal: Knowledge of patient specific risk factors will improve (DELETE if not current risk factor) Outcome: Progressing   Problem: Ischemic Stroke/TIA Tissue Perfusion: Goal: Complications of ischemic stroke/TIA will be minimized Outcome: Progressing   Problem: Education: Goal: Knowledge of General Education information will improve Description: Including pain rating scale, medication(s)/side effects and non-pharmacologic comfort measures Outcome: Progressing   Problem: Clinical Measurements: Goal: Respiratory complications will improve Outcome: Progressing   Problem: Clinical Measurements: Goal: Cardiovascular complication will be avoided Outcome: Progressing   Problem: Activity: Goal: Risk for activity intolerance will decrease Outcome: Progressing   Problem: Pain Managment: Goal: General experience of comfort will improve and/or be controlled Outcome: Progressing   Problem: Safety: Goal: Ability to remain free from injury will improve Outcome: Progressing

## 2024-09-15 NOTE — Progress Notes (Signed)
 Physical Therapy Treatment Patient Details Name: Erin Browning MRN: [id number] DOB: 1923/03/26 Today's Date: 09/15/2024   History of Present Illness Pt is a 89 y.o. female presenting after unwitnessed fall at home with LLE weakness & pain. CT and X-rays negative. PMH significant for HTN, HOH, pacemaker.    PT Comments  Pt was pleasant and motivated to participate during the session and put forth good effort throughout.  Pt required +2 assist with bed mobility tasks with log roll training provided for decreased caregiver assistance.  Pt was able to perform multiple sit to/from stands from an elevated EOB with a RW and then x 1 with a sara stedy lift +2 mod A and cues for sequencing.  While in standing with a RW pt unable to advance either LE during attempts to ambulate with sara stedy used and recommending to nursing for transfers from bed to/from chair.  Pt will benefit from continued PT services upon discharge to safely address deficits listed in patient problem list for decreased caregiver assistance and eventual return to PLOF.      If plan is discharge home, recommend the following: Two people to help with walking and/or transfers;Two people to help with bathing/dressing/bathroom;Assist for transportation;Help with stairs or ramp for entrance   Can travel by private vehicle     No  Equipment Recommendations  Other (comment) (TBD at next venue of care)    Recommendations for Other Services       Precautions / Restrictions Precautions Precautions: Fall;ICD/Pacemaker Restrictions Weight Bearing Restrictions Per Provider Order: No     Mobility  Bed Mobility Overal bed mobility: Needs Assistance Bed Mobility: Rolling, Sidelying to Sit Rolling: Mod assist, +2 for physical assistance Sidelying to sit: Mod assist, Used rails, +2 for physical assistance       General bed mobility comments: Max multi-modal cues for sequencing with +2 mod A for BLE and trunk control     Transfers Overall transfer level: Needs assistance Equipment used: Rolling walker (2 wheels) Transfers: Sit to/from Stand Sit to Stand: Mod assist, +2 physical assistance, From elevated surface           General transfer comment: Pt able to peform multiple sit to/from stand transfers from elevted EOB with +2 mod A but unable to advance either LE during amb attempt; +2 mod A with transfer with sara stedy lift with cues for sequencing for transfer from bed to chair Transfer via Lift Equipment: Camie Lift  Ambulation/Gait               General Gait Details: Unable   Stairs             Wheelchair Mobility     Tilt Bed    Modified Rankin (Stroke Patients Only)       Balance Overall balance assessment: Needs assistance Sitting-balance support: Feet supported, Bilateral upper extremity supported Sitting balance-Leahy Scale: Good     Standing balance support: Bilateral upper extremity supported, Reliant on assistive device for balance, During functional activity Standing balance-Leahy Scale: Poor                              Communication Communication Communication: Impaired Factors Affecting Communication: Hearing impaired  Cognition Arousal: Alert Behavior During Therapy: WFL for tasks assessed/performed   PT - Cognitive impairments: No apparent impairments  Following commands: Intact      Cueing Cueing Techniques: Verbal cues, Tactile cues, Visual cues  Exercises Total Joint Exercises Long Arc Quad: AROM, Strengthening, Both, 5 reps, 10 reps Knee Flexion: AROM, Strengthening, Both, 5 reps, 10 reps Other Exercises Other Exercises: static standing at EOB 2 x 30 sec for increased strength and activity tolerance    General Comments        Pertinent Vitals/Pain Pain Assessment Pain Assessment: No/denies pain    Home Living                          Prior Function            PT  Goals (current goals can now be found in the care plan section) Progress towards PT goals: PT to reassess next treatment    Frequency    Min 2X/week      PT Plan      Co-evaluation              AM-PAC PT 6 Clicks Mobility   Outcome Measure  Help needed turning from your back to your side while in a flat bed without using bedrails?: A Lot Help needed moving from lying on your back to sitting on the side of a flat bed without using bedrails?: A Lot Help needed moving to and from a bed to a chair (including a wheelchair)?: Total Help needed standing up from a chair using your arms (e.g., wheelchair or bedside chair)?: A Lot Help needed to walk in hospital room?: Total Help needed climbing 3-5 steps with a railing? : Total 6 Click Score: 9    End of Session Equipment Utilized During Treatment: Gait belt Activity Tolerance: Patient tolerated treatment well Patient left: in chair;with call bell/phone within reach;with chair alarm set;with family/visitor present Nurse Communication: Mobility status;Other (comment) (recommend use of sara stedy for transfers) PT Visit Diagnosis: Unsteadiness on feet (R26.81);Other abnormalities of gait and mobility (R26.89);Difficulty in walking, not elsewhere classified (R26.2);History of falling (Z91.81);Muscle weakness (generalized) (M62.81);Pain Pain - Right/Left: Left Pain - part of body: Leg     Time: 8492-8472 PT Time Calculation (min) (ACUTE ONLY): 20 min  Charges:    $Therapeutic Activity: 8-22 mins PT General Charges $$ ACUTE PT VISIT: 1 Visit                     D. Glendia Bertin PT, DPT 09/15/24, 4:28 PM

## 2024-09-15 NOTE — TOC Progression Note (Signed)
 Transition of Care [facility]) - Progression Note    Patient Details  Name: Erin Browning MRN: [id number] Date of Birth: March 13, 1923  [facility] CM/SW Contact  [name], RN Phone Number: 09/15/2024, 10:56 AM  Clinical Narrative:     Patient with a bed offer from [facility] in [place], [place] reached out to patient's daughter Darice and granddaughter Monica, but didn't get an answer.  TOC will continue to outreach.  Expected Discharge Plan: Skilled Nursing Facility Barriers to Discharge: No SNF bed               Expected Discharge Plan and Services     Post Acute Care Choice: Skilled Nursing Facility                                         Social Drivers of Health (SDOH) Interventions SDOH Screenings   Food Insecurity: No Food Insecurity (09/14/2024)  Housing: Low Risk  (09/14/2024)  Transportation Needs: No Transportation Needs (09/13/2024)  Utilities: Not At Risk (09/13/2024)  Financial Resource Strain: Low Risk  (06/01/2024)   Received from [facility]  Social Connections: Moderately Isolated (09/13/2024)  Tobacco Use: Medium Risk (09/13/2024)    Readmission Risk Interventions     No data to display

## 2024-09-16 DIAGNOSIS — R29898 Other symptoms and signs involving the musculoskeletal system: Secondary | ICD-10-CM | POA: Diagnosis not present

## 2024-09-16 DIAGNOSIS — R531 Weakness: Secondary | ICD-10-CM | POA: Diagnosis not present

## 2024-09-16 LAB — BASIC METABOLIC PANEL WITH GFR
Anion gap: 7 (ref 5–15)
BUN: 21 mg/dL (ref 8–23)
CO2: 26 mmol/L (ref 22–32)
Calcium: 8.2 mg/dL — ABNORMAL LOW (ref 8.9–10.3)
Chloride: 100 mmol/L (ref 98–111)
Creatinine, Ser: 0.91 mg/dL (ref 0.44–1.00)
GFR, Estimated: 57 mL/min — ABNORMAL LOW (ref >60)
Glucose, Bld: 98 mg/dL (ref 70–99)
Potassium: 3.9 mmol/L (ref 3.5–5.1)
Sodium: 133 mmol/L — ABNORMAL LOW (ref 135–145)

## 2024-09-16 LAB — CBC
HCT: 40.3 % (ref 36.0–46.0)
Hemoglobin: 13.1 g/dL (ref 12.0–15.0)
MCH: 27.3 pg (ref 26.0–34.0)
MCHC: 32.5 g/dL (ref 30.0–36.0)
MCV: 84 fL (ref 80.0–100.0)
Platelets: 98 K/uL — ABNORMAL LOW (ref 150–400)
RBC: 4.8 MIL/uL (ref 3.87–5.11)
RDW: 13.2 % (ref 11.5–15.5)
WBC: 7.3 K/uL (ref 4.0–10.5)
nRBC: 0 % (ref 0.0–0.2)

## 2024-09-16 MED ORDER — DOCUSATE SODIUM 100 MG PO CAPS
200.0000 mg | ORAL_CAPSULE | Freq: Two times a day (BID) | ORAL | Status: DC
Start: 2024-09-16 — End: 2024-09-19
  Administered 2024-09-16 – 2024-09-19 (×6): 200 mg via ORAL
  Filled 2024-09-16 (×7): qty 2

## 2024-09-16 NOTE — Plan of Care (Signed)

## 2024-09-16 NOTE — Progress Notes (Signed)
 PROGRESS NOTE    Erin Browning  FMW:[id number] DOB: 03-Oct-1923 DOA: 09/13/2024 PCP: Cleotilde Oneil FALCON, MD    Assessment & Plan:   Principal Problem:   Stroke-like symptom Active Problems:   Complete heart block (HCC)   Pacemaker   Essential hypertension   Thrombocytopenia   Weakness of left lower extremity   HOH (hard of hearing)  Assessment and Plan: Acute b/l LE weakness: persistent. CT head x 2 neg for any acute intracranial abnormalities. C/o b/l leg pain & weakness. PT/OT recs SNF. Tylenol , percocet prn for pain. Still waiting on SNF placement as per CM    HTN: BP is WNL currently. Holding home dose of triameterene-HCTZ  Thrombocytopenia: etiology unclear. Labile. Will continue to monitor   Constipation: started on colace  Complete heart block: s/p pacemaker placement 12 years ago. No acute issues        DVT prophylaxis: lovenox Code Status: DNR Family Communication:  Disposition Plan: likely d/c to SNF  Level of care: Telemetry Medical  Status is: Observation The patient remains OBS appropriate and will d/c before 2 midnights. Still waiting on SNF placement as per CM     Consultants:    Procedures:   Antimicrobials:    Subjective: Pt c/o constipation   Objective: Vitals:   09/15/24 1917 09/15/24 2355 09/16/24 0435 09/16/24 0816  BP: (!) 113/56 115/60 (!) 103/51 113/62  Pulse: 87 98  (!) 105  Resp: 15 15 16 16   Temp: 97.8 F (36.6 C) 98.2 F (36.8 C) 98.9 F (37.2 C) 98.4 F (36.9 C)  TempSrc: Oral  Oral Oral  SpO2: 93% 93% 90% 94%  Weight:      Height:        Intake/Output Summary (Last 24 hours) at 09/16/2024 0845 Last data filed at 09/15/2024 1900 Gross per 24 hour  Intake 540 ml  Output --  Net 540 ml   Filed Weights   09/13/24 1249  Weight: 63.2 kg    Examination:  General exam: appears uncomfortable Respiratory system: clear breath sounds b/l Cardiovascular system: S1/S2+. No rubs or clicks Gastrointestinal system:  abd is soft, NT, ND & hypoactive bowel sounds Central nervous system: alert & oriented. Moves all extremities Psychiatry: Judgement and insight appears poor. Flat mood and affect     Data Reviewed: I have personally reviewed following labs and imaging studies  CBC: Recent Labs  Lab 09/13/24 1253 09/15/24 0313 09/16/24 0420  WBC 7.1 8.2 7.3  HGB 15.2* 13.9 13.1  HCT 47.6* 42.9 40.3  MCV 85.3 84.3 84.0  PLT 101* 94* 98*   Basic Metabolic Panel: Recent Labs  Lab 09/13/24 1253 09/15/24 0313 09/16/24 0420  NA 139 138 133*  K 4.2 3.7 3.9  CL 103 102 100  CO2 26 25 26   GLUCOSE 133* 123* 98  BUN 17 21 21   CREATININE 0.87 1.00 0.91  CALCIUM 9.2 8.6* 8.2*   GFR: Estimated Creatinine Clearance: 28.7 mL/min (by C-G formula based on SCr of 0.91 mg/dL). Liver Function Tests: Recent Labs  Lab 09/13/24 1253  AST 22  ALT 14  ALKPHOS 101  BILITOT 1.1  PROT 7.1  ALBUMIN 3.7   No results for input(s): LIPASE, AMYLASE in the last 168 hours. No results for input(s): AMMONIA in the last 168 hours. Coagulation Profile: No results for input(s): INR, PROTIME in the last 168 hours. Cardiac Enzymes: No results for input(s): CKTOTAL, CKMB, CKMBINDEX, TROPONINI in the last 168 hours. BNP (last 3 results) No results for input(s): PROBNP  in the last 8760 hours. HbA1C: Recent Labs    09/14/24 0404  HGBA1C 5.5   CBG: Recent Labs  Lab 09/14/24 0748  GLUCAP 94   Lipid Profile: Recent Labs    09/14/24 0404  CHOL 137  HDL 38*  LDLCALC 83  TRIG 80  CHOLHDL 3.6   Thyroid  Function Tests: No results for input(s): TSH, T4TOTAL, FREET4, T3FREE, THYROIDAB in the last 72 hours. Anemia Panel: No results for input(s): VITAMINB12, FOLATE, FERRITIN, TIBC, IRON, RETICCTPCT in the last 72 hours. Sepsis Labs: No results for input(s): PROCALCITON, LATICACIDVEN in the last 168 hours.  No results found for this or any previous visit (from the  past 240 hours).       Radiology Studies: No results found.       Scheduled Meds:  enoxaparin (LOVENOX) injection  30 mg Subcutaneous Q24H   Continuous Infusions:   LOS: 0 days      Anthony CHRISTELLA Pouch, MD [facility] Pager [phone]  If 7PM-7AM, please contact night-coverage [email or web address] 09/16/2024, 8:45 AM

## 2024-09-16 NOTE — Progress Notes (Signed)
 Mobility Specialist - Progress Note   09/16/24 1110  Mobility  Activity Pivoted/transferred to/from [facility];Pivoted/transferred from bed to chair  Level of Assistance +2 (takes two people)  Location manager Ambulated (ft) 6 ft  Activity Response Tolerated well  Mobility visit 1 Mobility  Mobility Specialist Start Time (ACUTE ONLY) 1032  Mobility Specialist Stop Time (ACUTE ONLY) 1102  Mobility Specialist Time Calculation (min) (ACUTE ONLY) 30 min   Pt completed bed mob MinA. Pt STS to RW ModA +2, unable to come to full standing. Pt transferred to/from the [facility] and left seated in the recliner. Pt expressed BLE weakness throughout session, however put forth full effort during mobility. Pt left seated with alarm set and needs within reach. NT and family present at bedside.  America Silvan Mobility Specialist 09/16/24 12:05 PM

## 2024-09-16 NOTE — Plan of Care (Signed)
  Problem: Education: Goal: Knowledge of disease or condition will improve Outcome: Progressing Goal: Knowledge of secondary prevention will improve (MUST DOCUMENT ALL) Outcome: Progressing Goal: Knowledge of patient specific risk factors will improve (DELETE if not current risk factor) Outcome: Progressing   Problem: Ischemic Stroke/TIA Tissue Perfusion: Goal: Complications of ischemic stroke/TIA will be minimized Outcome: Progressing   Problem: Coping: Goal: Will verbalize positive feelings about self Outcome: Progressing Goal: Will identify appropriate support needs Outcome: Progressing   Problem: Health Behavior/Discharge Planning: Goal: Ability to manage health-related needs will improve Outcome: Progressing Goal: Goals will be collaboratively established with patient/family Outcome: Progressing   Problem: Self-Care: Goal: Ability to participate in self-care as condition permits will improve Outcome: Progressing Goal: Verbalization of feelings and concerns over difficulty with self-care will improve Outcome: Progressing Goal: Ability to communicate needs accurately will improve Outcome: Progressing   Problem: Nutrition: Goal: Risk of aspiration will decrease Outcome: Progressing Goal: Dietary intake will improve Outcome: Progressing   Problem: Education: Goal: Knowledge of General Education information will improve Description: Including pain rating scale, medication(s)/side effects and non-pharmacologic comfort measures Outcome: Progressing   Problem: Health Behavior/Discharge Planning: Goal: Ability to manage health-related needs will improve Outcome: Progressing   Problem: Clinical Measurements: Goal: Ability to maintain clinical measurements within normal limits will improve Outcome: Progressing Goal: Will remain free from infection Outcome: Progressing Goal: Diagnostic test results will improve Outcome: Progressing Goal: Respiratory complications will  improve Outcome: Progressing Goal: Cardiovascular complication will be avoided Outcome: Progressing   Problem: Activity: Goal: Risk for activity intolerance will decrease Outcome: Progressing   Problem: Nutrition: Goal: Adequate nutrition will be maintained Outcome: Progressing   Problem: Pain Managment: Goal: General experience of comfort will improve and/or be controlled Outcome: Progressing   Problem: Safety: Goal: Ability to remain free from injury will improve Outcome: Progressing

## 2024-09-16 NOTE — Progress Notes (Signed)
 Occupational Therapy Treatment Patient Details Name: Erin Browning MRN: [id number] DOB: September 23, 1923 Today's Date: 09/16/2024   History of present illness Pt is a 89 y.o. female presenting after unwitnessed fall at home with LLE weakness & pain. CT and X-rays negative. PMH significant for HTN, HOH, pacemaker.   OT comments  Pt seen for OT treatment on this date. Upon arrival to room pt seated in recliner chair, agreeable to tx. Patient engaged in dynamic sitting balance tasks at edge of recliner with light functional reaching within BOS as a preparatory activity for ADL transfer training. Pt requires cuing for hand/foot placement during transfers and required Max A for sit to stand transfers with difficulty achieving upright standing posture at RW level. Pt's daughter who was present at bedside had questions regarding SNF level care related to therapy services.  Pt making good progress toward goals, will continue to follow POC. Discharge recommendation remains appropriate.        If plan is discharge home, recommend the following:  Two people to help with walking and/or transfers;A lot of help with bathing/dressing/bathroom;Assistance with cooking/housework;Direct supervision/assist for medications management;Direct supervision/assist for financial management;Assist for transportation;Help with stairs or ramp for entrance;Supervision due to cognitive status   Equipment Recommendations  None recommended by OT    Recommendations for Other Services      Precautions / Restrictions Precautions Precautions: Fall;ICD/Pacemaker Recall of Precautions/Restrictions: Impaired Restrictions Weight Bearing Restrictions Per Provider Order: No       Mobility Bed Mobility               General bed mobility comments: Pt seated in recliner chair at start/end of session.    Transfers Overall transfer level: Needs assistance Equipment used: Rolling walker (2 wheels) Transfers: Sit to/from  Stand Sit to Stand: Max assist           General transfer comment: Max A to attempt clearing backside from seat x 2 attempts in order to simulate toilet transfers, unable to sustain upright standing posture.     Balance Overall balance assessment: Needs assistance Sitting-balance support: Feet supported, Bilateral upper extremity supported Sitting balance-Leahy Scale: Good Sitting balance - Comments: VC for upright sitting posture at end of recliner chair, CGA for forward sitting to ensure no LOB   Standing balance support: Bilateral upper extremity supported, Reliant on assistive device for balance, During functional activity Standing balance-Leahy Scale: Poor Standing balance comment: Max A to attempt clearing backside from seat x 2 attempts in order to simulate toilet transfers, unable to sustain upright standing posture.                           ADL either performed or assessed with clinical judgement   ADL Overall ADL's : Needs assistance/impaired                                       General ADL Comments: Max A to attempt clearing backside from seat x 2 attempts in order to simulate toilet transfers, unable to sustain upright standing posture.    Extremity/Trunk Assessment Upper Extremity Assessment Upper Extremity Assessment: Generalized weakness            Vision Baseline Vision/History: 1 Wears glasses     Perception     Praxis     Communication Communication Communication: Impaired Factors Affecting Communication: Hearing impaired   Cognition Arousal: Alert  Behavior During Therapy: WFL for tasks assessed/performed Cognition: Cognition impaired, Difficult to assess Difficult to assess due to: Hard of hearing/deaf         Executive functioning impairment (select all impairments): Problem solving, Reasoning                   Following commands: Intact        Cueing   Cueing Techniques: Verbal cues, Tactile cues,  Visual cues  Exercises Other Exercises Other Exercises: education/training on hand/foot placement for functional transfers    Shoulder Instructions       General Comments      Pertinent Vitals/ Pain       Pain Assessment Faces Pain Scale: Hurts even more Pain Location: L leg.  Reports pain in BLEs however reports pain in LLE is greater Pain Descriptors / Indicators: Aching, Discomfort, Grimacing  Home Living                                          Prior Functioning/Environment              Frequency  Min 2X/week        Progress Toward Goals  OT Goals(current goals can now be found in the care plan section)  Progress towards OT goals: Progressing toward goals  Acute Rehab OT Goals Potential to Achieve Goals: Fair  Plan      Co-evaluation                 AM-PAC OT 6 Clicks Daily Activity     Outcome Measure   Help from another person eating meals?: None Help from another person taking care of personal grooming?: A Little Help from another person toileting, which includes using toliet, bedpan, or urinal?: A Lot Help from another person bathing (including washing, rinsing, drying)?: A Lot Help from another person to put on and taking off regular upper body clothing?: A Lot Help from another person to put on and taking off regular lower body clothing?: A Lot 6 Click Score: 15    End of Session Equipment Utilized During Treatment: Rolling walker (2 wheels);Gait belt  OT Visit Diagnosis: Unsteadiness on feet (R26.81);Other abnormalities of gait and mobility (R26.89);History of falling (Z91.81);Muscle weakness (generalized) (M62.81)   Activity Tolerance Patient tolerated treatment well   Patient Left in chair;with call bell/phone within reach;with family/visitor present;with chair alarm set;with nursing/sitter in room   Nurse Communication Mobility status        Time: 8857-8798 OT Time Calculation (min): 19 min  Charges: OT  General Charges $OT Visit: 1 Visit OT Treatments $Therapeutic Activity: 8-22 mins  Harlene Sharps OTR/L   Harlene LITTIE Sharps 09/16/2024, 12:33 PM

## 2024-09-16 NOTE — Progress Notes (Signed)
 Pt fatigued after sitting up in chair since this morning. Mod/MaxA given for safe sit<>stand transfers and bed mobility, short distance gait from chair to bed with RW and MinA due to high fall risk and B LE weakness. Pt positioned to comfort in bed with all need in reach.   09/16/24 1500  PT Visit Information  Assistance Needed +1  History of Present Illness Pt is a 89 y.o. female presenting after unwitnessed fall at home with LLE weakness & pain. CT and X-rays negative. PMH significant for HTN, HOH, pacemaker.  Subjective Data  Patient Stated Goal Pt wants to get stronger. pt agreeable to SNF placement  Precautions  Precautions Fall;ICD/Pacemaker  Recall of Precautions/Restrictions Impaired  Restrictions  Weight Bearing Restrictions Per Provider Order No  Pain Assessment  Pain Assessment Faces  Faces Pain Scale 4  Pain Location L leg.  Reports pain in BLEs however reports pain in LLE is greater  Pain Descriptors / Indicators Aching;Discomfort;Grimacing  Pain Intervention(s) Limited activity within patient's tolerance  Cognition  Arousal Alert  Behavior During Therapy WFL for tasks assessed/performed  PT - Cognitive impairments No apparent impairments  Following Commands  Following commands Intact  Cueing  Cueing Techniques Verbal cues;Tactile cues;Visual cues  Communication  Communication Impaired  Factors Affecting Communication Hearing impaired  Bed Mobility  Overal bed mobility Needs Assistance  Bed Mobility Sit to Supine  Sit to supine Min assist;+2 for physical assistance  General bed mobility comments  (MinA of 2 for trunk and LE assist up onto bed due to pain)  Transfers  Overall transfer level Needs assistance  Transfers Sit to/from Stand  Sit to Stand Max assist  General transfer comment  (MaxA to power up to standing due to B LE weakness, several steps from chair to bed with RW and MinA for balance and device management)  General Comments  General comments (skin  integrity, edema, etc.) Purewick in place, grossly 3/5 B LE strength  PT - End of Session  Equipment Utilized During Treatment Gait belt  Activity Tolerance Patient tolerated treatment well  Patient left in bed;with call bell/phone within reach;with bed alarm set  Nurse Communication Mobility status;Other (comment)   PT - Assessment/Plan  PT Visit Diagnosis Unsteadiness on feet (R26.81);Other abnormalities of gait and mobility (R26.89);Difficulty in walking, not elsewhere classified (R26.2);History of falling (Z91.81);Muscle weakness (generalized) (M62.81);Pain  Pain - Right/Left Left  Pain - part of body Leg  PT Frequency (ACUTE ONLY) Min 2X/week  Follow Up Recommendations Skilled nursing-short term rehab (<3 hours/day)  Can patient physically be transported by private vehicle No  Patient can return home with the following Two people to help with walking and/or transfers;Two people to help with bathing/dressing/bathroom;Assist for transportation;Help with stairs or ramp for entrance  PT equipment Other (comment)  AM-PAC PT 6 Clicks Mobility Outcome Measure (Version 2)  Help needed turning from your back to your side while in a flat bed without using bedrails? 2  Help needed moving from lying on your back to sitting on the side of a flat bed without using bedrails? 2  Help needed moving to and from a bed to a chair (including a wheelchair)? 1  Help needed standing up from a chair using your arms (e.g., wheelchair or bedside chair)? 2  Help needed to walk in hospital room? 1  Help needed climbing 3-5 steps with a railing?  1  6 Click Score 9  Consider Recommendation of Discharge To: CIR/SNF/LTACH  Progressive Mobility  What is the  highest level of mobility based on the mobility assessment? Level 3 (Stands with assistance) - Balance while standing  and cannot march in place  Activity Stood at bedside;Pivoted/transferred from chair to bed  PT Goal Progression  Progress towards PT goals  Progressing toward goals  PT Time Calculation  PT Start Time (ACUTE ONLY) 1533  PT Stop Time (ACUTE ONLY) 1547  PT Time Calculation (min) (ACUTE ONLY) 14 min  PT General Charges  $$ ACUTE PT VISIT 1 Visit  PT Treatments  $Therapeutic Activity 8-22 mins   Darice Bohr, PTA

## 2024-09-16 NOTE — TOC Progression Note (Signed)
 Transition of Care [facility]) - Progression Note    Patient Details  Name: Erin Browning MRN: [id number] Date of Birth: May 02, 1923  Transition of Care [facility]) CM/SW Contact  [name], RN Phone Number: 09/16/2024, 1:28 PM  Clinical Narrative:     Pasrr [phone] [id number]  Expected Discharge Plan: Skilled Nursing Facility Barriers to Discharge: No SNF bed               Expected Discharge Plan and Services     Post Acute Care Choice: Skilled Nursing Facility                                         Social Drivers of Health (SDOH) Interventions SDOH Screenings   Food Insecurity: No Food Insecurity (09/14/2024)  Housing: Low Risk  (09/14/2024)  Transportation Needs: No Transportation Needs (09/13/2024)  Utilities: Not At Risk (09/13/2024)  Financial Resource Strain: Low Risk  (06/01/2024)   Received from [facility]  Social Connections: Moderately Isolated (09/13/2024)  Tobacco Use: Medium Risk (09/13/2024)    Readmission Risk Interventions     No data to display

## 2024-09-17 DIAGNOSIS — R29898 Other symptoms and signs involving the musculoskeletal system: Secondary | ICD-10-CM | POA: Diagnosis not present

## 2024-09-17 DIAGNOSIS — R531 Weakness: Secondary | ICD-10-CM

## 2024-09-17 LAB — BASIC METABOLIC PANEL WITH GFR
Anion gap: 11 (ref 5–15)
BUN: 19 mg/dL (ref 8–23)
CO2: 25 mmol/L (ref 22–32)
Calcium: 8.4 mg/dL — ABNORMAL LOW (ref 8.9–10.3)
Chloride: 98 mmol/L (ref 98–111)
Creatinine, Ser: 0.88 mg/dL (ref 0.44–1.00)
GFR, Estimated: 59 mL/min — ABNORMAL LOW (ref >60)
Glucose, Bld: 103 mg/dL — ABNORMAL HIGH (ref 70–99)
Potassium: 3.8 mmol/L (ref 3.5–5.1)
Sodium: 134 mmol/L — ABNORMAL LOW (ref 135–145)

## 2024-09-17 LAB — CBC
HCT: 40.6 % (ref 36.0–46.0)
Hemoglobin: 13.2 g/dL (ref 12.0–15.0)
MCH: 27 pg (ref 26.0–34.0)
MCHC: 32.5 g/dL (ref 30.0–36.0)
MCV: 83.2 fL (ref 80.0–100.0)
Platelets: 133 K/uL — ABNORMAL LOW (ref 150–400)
RBC: 4.88 MIL/uL (ref 3.87–5.11)
RDW: 13.2 % (ref 11.5–15.5)
WBC: 6.4 K/uL (ref 4.0–10.5)
nRBC: 0 % (ref 0.0–0.2)

## 2024-09-17 NOTE — TOC Progression Note (Signed)
 Transition of Care [facility]) - Progression Note    Patient Details  Name: Erin Browning MRN: [id number] Date of Birth: 28-Apr-1923  Transition of Care [facility]) CM/SW Contact  [place] L Lilou Kneip, [place] Phone Number: 09/17/2024, 9:19 AM  Clinical Narrative:     Auth approved for SNF: [id number]. CSW followed up with [name] to inquire if patient could admit today. CSW was advised that patient can't admit until Monday.   Medical team notified. Handoff updated.   Expected Discharge Plan: Skilled Nursing Facility Barriers to Discharge: No SNF bed               Expected Discharge Plan and Services     Post Acute Care Choice: Skilled Nursing Facility                                         Social Drivers of Health (SDOH) Interventions SDOH Screenings   Food Insecurity: No Food Insecurity (09/14/2024)  Housing: Low Risk  (09/14/2024)  Transportation Needs: No Transportation Needs (09/13/2024)  Utilities: Not At Risk (09/13/2024)  Financial Resource Strain: Low Risk  (06/01/2024)   Received from [facility]  Social Connections: Moderately Isolated (09/13/2024)  Tobacco Use: Medium Risk (09/13/2024)    Readmission Risk Interventions     No data to display

## 2024-09-17 NOTE — Plan of Care (Signed)
  Problem: Education: Goal: Knowledge of disease or condition will improve Outcome: Progressing   Problem: Ischemic Stroke/TIA Tissue Perfusion: Goal: Complications of ischemic stroke/TIA will be minimized Outcome: Progressing   Problem: Health Behavior/Discharge Planning: Goal: Ability to manage health-related needs will improve Outcome: Progressing   Problem: Self-Care: Goal: Ability to participate in self-care as condition permits will improve Outcome: Progressing

## 2024-09-17 NOTE — Progress Notes (Signed)
 PROGRESS NOTE    Erin Browning  FMW:[id number] DOB: Feb 06, 1923 DOA: 09/13/2024 PCP: Cleotilde Oneil FALCON, MD    Assessment & Plan:   Principal Problem:   Stroke-like symptom Active Problems:   Complete heart block (HCC)   Pacemaker   Essential hypertension   Thrombocytopenia   Weakness of left lower extremity   HOH (hard of hearing)  Assessment and Plan: Acute b/l LE weakness: persistent. CT head x 2 neg for any acute intracranial abnormalities. C/o b/l leg pain & weakness. PT/OT recs SNF. Tylenol , percocet prn for pain. Waiting on insurance auth    HTN: BP is currently WNL today. Holding home dose of triameterene-HCTZ  Thrombocytopenia: etiology unclear.  Trending up again today  Constipation: continue on colace   Complete heart block: s/p pacemaker placement 12 years ago. No acute issues        DVT prophylaxis: lovenox Code Status: DNR Family Communication:  Disposition Plan: likely d/c to SNF  Level of care: Telemetry Medical  Status is: Observation The patient remains OBS appropriate and will d/c before 2 midnights. Waiting on insurance auth still     Consultants:    Procedures:   Antimicrobials:    Subjective: Pt c/o generalized weakness  Objective: Vitals:   09/16/24 1547 09/16/24 1956 09/17/24 0429 09/17/24 0747  BP: 136/70 (!) 103/58 114/61 123/64  Pulse: (!) 104  (!) 106 99  Resp: 20 19 19 16   Temp: 98.8 F (37.1 C) 98.9 F (37.2 C) (!) 97.5 F (36.4 C) 98.4 F (36.9 C)  TempSrc: Oral Oral Oral Oral  SpO2: 95% 94% 94% 95%  Weight:      Height:        Intake/Output Summary (Last 24 hours) at 09/17/2024 0848 Last data filed at 09/17/2024 0430 Gross per 24 hour  Intake --  Output 1300 ml  Net -1300 ml   Filed Weights   09/13/24 1249  Weight: 63.2 kg    Examination:  General exam: appears calm Respiratory system: clear breath sounds b/l  Cardiovascular system: S1 & S2+. No rubs or clicks  Gastrointestinal system: abd is soft,  NT, ND & hypoactive bowel sounds Central nervous system: alert & awake. Moves all extremities  Psychiatry: Judgement and insight appears at baseline. Flat mood and affect    Data Reviewed: I have personally reviewed following labs and imaging studies  CBC: Recent Labs  Lab 09/13/24 1253 09/15/24 0313 09/16/24 0420 09/17/24 0529  WBC 7.1 8.2 7.3 6.4  HGB 15.2* 13.9 13.1 13.2  HCT 47.6* 42.9 40.3 40.6  MCV 85.3 84.3 84.0 83.2  PLT 101* 94* 98* 133*   Basic Metabolic Panel: Recent Labs  Lab 09/13/24 1253 09/15/24 0313 09/16/24 0420 09/17/24 0529  NA 139 138 133* 134*  K 4.2 3.7 3.9 3.8  CL 103 102 100 98  CO2 26 25 26 25   GLUCOSE 133* 123* 98 103*  BUN 17 21 21 19   CREATININE 0.87 1.00 0.91 0.88  CALCIUM 9.2 8.6* 8.2* 8.4*   GFR: Estimated Creatinine Clearance: 29.7 mL/min (by C-G formula based on SCr of 0.88 mg/dL). Liver Function Tests: Recent Labs  Lab 09/13/24 1253  AST 22  ALT 14  ALKPHOS 101  BILITOT 1.1  PROT 7.1  ALBUMIN 3.7   No results for input(s): LIPASE, AMYLASE in the last 168 hours. No results for input(s): AMMONIA in the last 168 hours. Coagulation Profile: No results for input(s): INR, PROTIME in the last 168 hours. Cardiac Enzymes: No results for input(s): CKTOTAL,  CKMB, CKMBINDEX, TROPONINI in the last 168 hours. BNP (last 3 results) No results for input(s): PROBNP in the last 8760 hours. HbA1C: No results for input(s): HGBA1C in the last 72 hours.  CBG: Recent Labs  Lab 09/14/24 0748  GLUCAP 94   Lipid Profile: No results for input(s): CHOL, HDL, LDLCALC, TRIG, CHOLHDL, LDLDIRECT in the last 72 hours.  Thyroid  Function Tests: No results for input(s): TSH, T4TOTAL, FREET4, T3FREE, THYROIDAB in the last 72 hours. Anemia Panel: No results for input(s): VITAMINB12, FOLATE, FERRITIN, TIBC, IRON, RETICCTPCT in the last 72 hours. Sepsis Labs: No results for input(s):  PROCALCITON, LATICACIDVEN in the last 168 hours.  No results found for this or any previous visit (from the past 240 hours).       Radiology Studies: No results found.       Scheduled Meds:  docusate sodium   200 mg Oral BID   enoxaparin (LOVENOX) injection  30 mg Subcutaneous Q24H   Continuous Infusions:   LOS: 0 days      Anthony CHRISTELLA Pouch, MD [facility] Pager [phone]  If 7PM-7AM, please contact night-coverage [email or web address] 09/17/2024, 8:48 AM

## 2024-09-17 NOTE — Plan of Care (Signed)
  Problem: Clinical Measurements: Goal: Ability to maintain clinical measurements within normal limits will improve Outcome: Progressing Goal: Will remain free from infection Outcome: Progressing Goal: Diagnostic test results will improve Outcome: Progressing   Problem: Elimination: Goal: Will not experience complications related to urinary retention Outcome: Progressing   Problem: Pain Managment: Goal: General experience of comfort will improve and/or be controlled Outcome: Progressing   Problem: Safety: Goal: Ability to remain free from injury will improve Outcome: Progressing   Problem: Skin Integrity: Goal: Risk for impaired skin integrity will decrease Outcome: Progressing

## 2024-09-18 DIAGNOSIS — R531 Weakness: Secondary | ICD-10-CM | POA: Diagnosis not present

## 2024-09-18 DIAGNOSIS — R29898 Other symptoms and signs involving the musculoskeletal system: Secondary | ICD-10-CM | POA: Diagnosis not present

## 2024-09-18 LAB — BASIC METABOLIC PANEL WITH GFR
Anion gap: 8 (ref 5–15)
BUN: 20 mg/dL (ref 8–23)
CO2: 26 mmol/L (ref 22–32)
Calcium: 8.4 mg/dL — ABNORMAL LOW (ref 8.9–10.3)
Chloride: 102 mmol/L (ref 98–111)
Creatinine, Ser: 1.02 mg/dL — ABNORMAL HIGH (ref 0.44–1.00)
GFR, Estimated: 49 mL/min — ABNORMAL LOW (ref >60)
Glucose, Bld: 106 mg/dL — ABNORMAL HIGH (ref 70–99)
Potassium: 3.8 mmol/L (ref 3.5–5.1)
Sodium: 136 mmol/L (ref 135–145)

## 2024-09-18 LAB — CBC
HCT: 40.1 % (ref 36.0–46.0)
Hemoglobin: 12.8 g/dL (ref 12.0–15.0)
MCH: 27 pg (ref 26.0–34.0)
MCHC: 31.9 g/dL (ref 30.0–36.0)
MCV: 84.6 fL (ref 80.0–100.0)
Platelets: 156 K/uL (ref 150–400)
RBC: 4.74 MIL/uL (ref 3.87–5.11)
RDW: 13.1 % (ref 11.5–15.5)
WBC: 6.4 K/uL (ref 4.0–10.5)
nRBC: 0 % (ref 0.0–0.2)

## 2024-09-18 NOTE — Plan of Care (Signed)
  Problem: Education: Goal: Knowledge of patient specific risk factors will improve (DELETE if not current risk factor) Outcome: Progressing   Problem: Ischemic Stroke/TIA Tissue Perfusion: Goal: Complications of ischemic stroke/TIA will be minimized Outcome: Progressing   Problem: Coping: Goal: Will verbalize positive feelings about self Outcome: Progressing   Problem: Health Behavior/Discharge Planning: Goal: Ability to manage health-related needs will improve Outcome: Progressing   Problem: Health Behavior/Discharge Planning: Goal: Ability to manage health-related needs will improve Outcome: Progressing

## 2024-09-18 NOTE — Plan of Care (Signed)
 Problem: Education: Goal: Knowledge of disease or condition will improve Outcome: Progressing Goal: Knowledge of secondary prevention will improve (MUST DOCUMENT ALL) Outcome: Progressing Goal: Knowledge of patient specific risk factors will improve (DELETE if not current risk factor) Outcome: Progressing   Problem: Ischemic Stroke/TIA Tissue Perfusion: Goal: Complications of ischemic stroke/TIA will be minimized Outcome: Progressing   Problem: Coping: Goal: Will verbalize positive feelings about self Outcome: Progressing Goal: Will identify appropriate support needs Outcome: Progressing   Problem: Health Behavior/Discharge Planning: Goal: Ability to manage health-related needs will improve Outcome: Progressing Goal: Goals will be collaboratively established with patient/family Outcome: Progressing   Problem: Self-Care: Goal: Ability to participate in self-care as condition permits will improve Outcome: Progressing Goal: Verbalization of feelings and concerns over difficulty with self-care will improve Outcome: Progressing Goal: Ability to communicate needs accurately will improve Outcome: Progressing   Problem: Nutrition: Goal: Risk of aspiration will decrease Outcome: Progressing Goal: Dietary intake will improve Outcome: Progressing   Problem: Education: Goal: Knowledge of General Education information will improve Description: Including pain rating scale, medication(s)/side effects and non-pharmacologic comfort measures Outcome: Progressing   Problem: Health Behavior/Discharge Planning: Goal: Ability to manage health-related needs will improve Outcome: Progressing   Problem: Clinical Measurements: Goal: Ability to maintain clinical measurements within normal limits will improve Outcome: Progressing Goal: Will remain free from infection Outcome: Progressing Goal: Diagnostic test results will improve Outcome: Progressing Goal: Respiratory complications will  improve Outcome: Progressing Goal: Cardiovascular complication will be avoided Outcome: Progressing   Problem: Activity: Goal: Risk for activity intolerance will decrease Outcome: Progressing   Problem: Nutrition: Goal: Adequate nutrition will be maintained Outcome: Progressing   Problem: Coping: Goal: Level of anxiety will decrease Outcome: Progressing   Problem: Elimination: Goal: Will not experience complications related to bowel motility Outcome: Progressing Goal: Will not experience complications related to urinary retention Outcome: Progressing   Problem: Pain Managment: Goal: General experience of comfort will improve and/or be controlled Outcome: Progressing   Problem: Safety: Goal: Ability to remain free from injury will improve Outcome: Progressing   Problem: Skin Integrity: Goal: Risk for impaired skin integrity will decrease Outcome: Progressing   Problem: Education: Goal: Knowledge of disease or condition will improve Outcome: Progressing Goal: Knowledge of secondary prevention will improve (MUST DOCUMENT ALL) Outcome: Progressing Goal: Knowledge of patient specific risk factors will improve (DELETE if not current risk factor) Outcome: Progressing   Problem: Ischemic Stroke/TIA Tissue Perfusion: Goal: Complications of ischemic stroke/TIA will be minimized Outcome: Progressing   Problem: Coping: Goal: Will verbalize positive feelings about self Outcome: Progressing Goal: Will identify appropriate support needs Outcome: Progressing   Problem: Health Behavior/Discharge Planning: Goal: Ability to manage health-related needs will improve Outcome: Progressing Goal: Goals will be collaboratively established with patient/family Outcome: Progressing   Problem: Self-Care: Goal: Ability to participate in self-care as condition permits will improve Outcome: Progressing Goal: Verbalization of feelings and concerns over difficulty with self-care will  improve Outcome: Progressing Goal: Ability to communicate needs accurately will improve Outcome: Progressing   Problem: Nutrition: Goal: Risk of aspiration will decrease Outcome: Progressing Goal: Dietary intake will improve Outcome: Progressing   Problem: Education: Goal: Knowledge of General Education information will improve Description: Including pain rating scale, medication(s)/side effects and non-pharmacologic comfort measures Outcome: Progressing   Problem: Health Behavior/Discharge Planning: Goal: Ability to manage health-related needs will improve Outcome: Progressing   Problem: Clinical Measurements: Goal: Ability to maintain clinical measurements within normal limits will improve Outcome: Progressing Goal: Will remain free from infection Outcome:  Progressing Goal: Diagnostic test results will improve Outcome: Progressing Goal: Respiratory complications will improve Outcome: Progressing Goal: Cardiovascular complication will be avoided Outcome: Progressing   Problem: Activity: Goal: Risk for activity intolerance will decrease Outcome: Progressing   Problem: Nutrition: Goal: Adequate nutrition will be maintained Outcome: Progressing   Problem: Coping: Goal: Level of anxiety will decrease Outcome: Progressing   Problem: Elimination: Goal: Will not experience complications related to bowel motility Outcome: Progressing Goal: Will not experience complications related to urinary retention Outcome: Progressing   Problem: Pain Managment: Goal: General experience of comfort will improve and/or be controlled Outcome: Progressing   Problem: Safety: Goal: Ability to remain free from injury will improve Outcome: Progressing   Problem: Skin Integrity: Goal: Risk for impaired skin integrity will decrease Outcome: Progressing   Problem: Education: Goal: Knowledge of disease or condition will improve Outcome: Progressing Goal: Knowledge of secondary  prevention will improve (MUST DOCUMENT ALL) Outcome: Progressing Goal: Knowledge of patient specific risk factors will improve (DELETE if not current risk factor) Outcome: Progressing   Problem: Ischemic Stroke/TIA Tissue Perfusion: Goal: Complications of ischemic stroke/TIA will be minimized Outcome: Progressing   Problem: Coping: Goal: Will verbalize positive feelings about self Outcome: Progressing Goal: Will identify appropriate support needs Outcome: Progressing   Problem: Health Behavior/Discharge Planning: Goal: Ability to manage health-related needs will improve Outcome: Progressing Goal: Goals will be collaboratively established with patient/family Outcome: Progressing   Problem: Self-Care: Goal: Ability to participate in self-care as condition permits will improve Outcome: Progressing Goal: Verbalization of feelings and concerns over difficulty with self-care will improve Outcome: Progressing Goal: Ability to communicate needs accurately will improve Outcome: Progressing   Problem: Nutrition: Goal: Risk of aspiration will decrease Outcome: Progressing Goal: Dietary intake will improve Outcome: Progressing   Problem: Education: Goal: Knowledge of General Education information will improve Description: Including pain rating scale, medication(s)/side effects and non-pharmacologic comfort measures Outcome: Progressing   Problem: Health Behavior/Discharge Planning: Goal: Ability to manage health-related needs will improve Outcome: Progressing   Problem: Clinical Measurements: Goal: Ability to maintain clinical measurements within normal limits will improve Outcome: Progressing Goal: Will remain free from infection Outcome: Progressing Goal: Diagnostic test results will improve Outcome: Progressing Goal: Respiratory complications will improve Outcome: Progressing Goal: Cardiovascular complication will be avoided Outcome: Progressing   Problem:  Activity: Goal: Risk for activity intolerance will decrease Outcome: Progressing   Problem: Nutrition: Goal: Adequate nutrition will be maintained Outcome: Progressing   Problem: Coping: Goal: Level of anxiety will decrease Outcome: Progressing   Problem: Elimination: Goal: Will not experience complications related to bowel motility Outcome: Progressing Goal: Will not experience complications related to urinary retention Outcome: Progressing   Problem: Pain Managment: Goal: General experience of comfort will improve and/or be controlled Outcome: Progressing   Problem: Safety: Goal: Ability to remain free from injury will improve Outcome: Progressing   Problem: Skin Integrity: Goal: Risk for impaired skin integrity will decrease Outcome: Progressing

## 2024-09-18 NOTE — Progress Notes (Signed)
 PROGRESS NOTE    Erin Browning  FMW:[id number] DOB: 10-26-1923 DOA: 09/13/2024 PCP: Cleotilde Oneil FALCON, MD    Assessment & Plan:   Principal Problem:   Stroke-like symptom Active Problems:   Complete heart block (HCC)   Pacemaker   Essential hypertension   Thrombocytopenia   Weakness of left lower extremity   HOH (hard of hearing)   Generalized weakness  Assessment and Plan: Acute b/l LE weakness: persistent. CT head x 2 neg for any acute intracranial abnormalities. Able to move b/l LE slightly better today as per pt. PT/OT recs SNF. Tylenol , percocet prn for pain.    HTN: BP is still WNL currently. Holding home dose of triameterene-HCTZ  Thrombocytopenia: WNL today   Constipation: resolved. Continue on colace  Complete heart block: s/p pacemaker placement 12 years ago. No acute issues        DVT prophylaxis: lovenox Code Status: DNR Family Communication:  Disposition Plan: likely d/c to SNF  Level of care: Telemetry Medical  Status is: Observation The patient remains OBS appropriate and will d/c before 2 midnights. [name] wont take pt until tomorrow     Consultants:    Procedures:   Antimicrobials:    Subjective: Pt c/o fatigue   Objective: Vitals:   09/17/24 1632 09/17/24 2014 09/18/24 0345 09/18/24 0812  BP: 122/66 110/78 110/63 128/62  Pulse: 97 99 94 90  Resp: 20 18 18 18   Temp: 98 F (36.7 C) 98.3 F (36.8 C) 98.2 F (36.8 C) 98.5 F (36.9 C)  TempSrc: Oral Oral Oral Oral  SpO2: 92% 94% 95% 93%  Weight:      Height:        Intake/Output Summary (Last 24 hours) at 09/18/2024 0849 Last data filed at 09/17/2024 1650 Gross per 24 hour  Intake 240 ml  Output 300 ml  Net -60 ml   Filed Weights   09/13/24 1249  Weight: 63.2 kg    Examination:  General exam: appears calm & comfortable  Respiratory system: clear breath sounds b/l  Cardiovascular system: S1/S2+. No rubs or clicks Gastrointestinal system: abd is soft, NT, ND &  normal bowel sounds Central nervous system: alert & awake. Moves all extremities  Psychiatry: judgement and insight appears at baseline. Flat mood and affect    Data Reviewed: I have personally reviewed following labs and imaging studies  CBC: Recent Labs  Lab 09/13/24 1253 09/15/24 0313 09/16/24 0420 09/17/24 0529 09/18/24 0339  WBC 7.1 8.2 7.3 6.4 6.4  HGB 15.2* 13.9 13.1 13.2 12.8  HCT 47.6* 42.9 40.3 40.6 40.1  MCV 85.3 84.3 84.0 83.2 84.6  PLT 101* 94* 98* 133* 156   Basic Metabolic Panel: Recent Labs  Lab 09/13/24 1253 09/15/24 0313 09/16/24 0420 09/17/24 0529 09/18/24 0339  NA 139 138 133* 134* 136  K 4.2 3.7 3.9 3.8 3.8  CL 103 102 100 98 102  CO2 26 25 26 25 26   GLUCOSE 133* 123* 98 103* 106*  BUN 17 21 21 19 20   CREATININE 0.87 1.00 0.91 0.88 1.02*  CALCIUM 9.2 8.6* 8.2* 8.4* 8.4*   GFR: Estimated Creatinine Clearance: 25.6 mL/min (A) (by C-G formula based on SCr of 1.02 mg/dL (H)). Liver Function Tests: Recent Labs  Lab 09/13/24 1253  AST 22  ALT 14  ALKPHOS 101  BILITOT 1.1  PROT 7.1  ALBUMIN 3.7   No results for input(s): LIPASE, AMYLASE in the last 168 hours. No results for input(s): AMMONIA in the last 168 hours. Coagulation  Profile: No results for input(s): INR, PROTIME in the last 168 hours. Cardiac Enzymes: No results for input(s): CKTOTAL, CKMB, CKMBINDEX, TROPONINI in the last 168 hours. BNP (last 3 results) No results for input(s): PROBNP in the last 8760 hours. HbA1C: No results for input(s): HGBA1C in the last 72 hours.  CBG: Recent Labs  Lab 09/14/24 0748  GLUCAP 94   Lipid Profile: No results for input(s): CHOL, HDL, LDLCALC, TRIG, CHOLHDL, LDLDIRECT in the last 72 hours.  Thyroid  Function Tests: No results for input(s): TSH, T4TOTAL, FREET4, T3FREE, THYROIDAB in the last 72 hours. Anemia Panel: No results for input(s): VITAMINB12, FOLATE, FERRITIN, TIBC, IRON,  RETICCTPCT in the last 72 hours. Sepsis Labs: No results for input(s): PROCALCITON, LATICACIDVEN in the last 168 hours.  No results found for this or any previous visit (from the past 240 hours).       Radiology Studies: No results found.       Scheduled Meds:  docusate sodium   200 mg Oral BID   enoxaparin (LOVENOX) injection  30 mg Subcutaneous Q24H   Continuous Infusions:   LOS: 0 days      Anthony CHRISTELLA Pouch, MD [facility] Pager [phone]  If 7PM-7AM, please contact night-coverage [email or web address] 09/18/2024, 8:49 AM

## 2024-09-19 DIAGNOSIS — I442 Atrioventricular block, complete: Secondary | ICD-10-CM | POA: Diagnosis not present

## 2024-09-19 DIAGNOSIS — I110 Hypertensive heart disease with heart failure: Secondary | ICD-10-CM | POA: Diagnosis not present

## 2024-09-19 DIAGNOSIS — D519 Vitamin B12 deficiency anemia, unspecified: Secondary | ICD-10-CM | POA: Diagnosis not present

## 2024-09-19 DIAGNOSIS — R29898 Other symptoms and signs involving the musculoskeletal system: Secondary | ICD-10-CM | POA: Diagnosis not present

## 2024-09-19 DIAGNOSIS — R531 Weakness: Secondary | ICD-10-CM | POA: Diagnosis not present

## 2024-09-19 DIAGNOSIS — D693 Immune thrombocytopenic purpura: Secondary | ICD-10-CM | POA: Diagnosis not present

## 2024-09-19 DIAGNOSIS — M1990 Unspecified osteoarthritis, unspecified site: Secondary | ICD-10-CM | POA: Diagnosis not present

## 2024-09-19 DIAGNOSIS — R2689 Other abnormalities of gait and mobility: Secondary | ICD-10-CM | POA: Diagnosis not present

## 2024-09-19 DIAGNOSIS — D696 Thrombocytopenia, unspecified: Secondary | ICD-10-CM | POA: Diagnosis not present

## 2024-09-19 DIAGNOSIS — R2990 Unspecified symptoms and signs involving the nervous system: Secondary | ICD-10-CM | POA: Diagnosis not present

## 2024-09-19 DIAGNOSIS — M6281 Muscle weakness (generalized): Secondary | ICD-10-CM | POA: Diagnosis not present

## 2024-09-19 DIAGNOSIS — M810 Age-related osteoporosis without current pathological fracture: Secondary | ICD-10-CM | POA: Diagnosis not present

## 2024-09-19 DIAGNOSIS — Z9181 History of falling: Secondary | ICD-10-CM | POA: Diagnosis not present

## 2024-09-19 DIAGNOSIS — G319 Degenerative disease of nervous system, unspecified: Secondary | ICD-10-CM | POA: Diagnosis not present

## 2024-09-19 DIAGNOSIS — I455 Other specified heart block: Secondary | ICD-10-CM | POA: Diagnosis not present

## 2024-09-19 DIAGNOSIS — Z950 Presence of cardiac pacemaker: Secondary | ICD-10-CM | POA: Diagnosis not present

## 2024-09-19 DIAGNOSIS — I509 Heart failure, unspecified: Secondary | ICD-10-CM | POA: Diagnosis not present

## 2024-09-19 DIAGNOSIS — R2681 Unsteadiness on feet: Secondary | ICD-10-CM | POA: Diagnosis not present

## 2024-09-19 DIAGNOSIS — Z7401 Bed confinement status: Secondary | ICD-10-CM | POA: Diagnosis not present

## 2024-09-19 DIAGNOSIS — I6930 Unspecified sequelae of cerebral infarction: Secondary | ICD-10-CM | POA: Diagnosis not present

## 2024-09-19 LAB — BASIC METABOLIC PANEL WITH GFR
Anion gap: 7 (ref 5–15)
BUN: 15 mg/dL (ref 8–23)
CO2: 28 mmol/L (ref 22–32)
Calcium: 8.5 mg/dL — ABNORMAL LOW (ref 8.9–10.3)
Chloride: 101 mmol/L (ref 98–111)
Creatinine, Ser: 0.67 mg/dL (ref 0.44–1.00)
GFR, Estimated: >60 mL/min (ref >60)
Glucose, Bld: 128 mg/dL — ABNORMAL HIGH (ref 70–99)
Potassium: 3.9 mmol/L (ref 3.5–5.1)
Sodium: 136 mmol/L (ref 135–145)

## 2024-09-19 LAB — CBC
HCT: 39.7 % (ref 36.0–46.0)
Hemoglobin: 12.7 g/dL (ref 12.0–15.0)
MCH: 27 pg (ref 26.0–34.0)
MCHC: 32 g/dL (ref 30.0–36.0)
MCV: 84.5 fL (ref 80.0–100.0)
Platelets: 158 K/uL (ref 150–400)
RBC: 4.7 MIL/uL (ref 3.87–5.11)
RDW: 13.1 % (ref 11.5–15.5)
WBC: 6.2 K/uL (ref 4.0–10.5)
nRBC: 0 % (ref 0.0–0.2)

## 2024-09-19 MED ORDER — ENOXAPARIN SODIUM 40 MG/0.4ML IJ SOSY: 40.0000 mg | PREFILLED_SYRINGE | INTRAMUSCULAR | Status: DC

## 2024-09-19 NOTE — Plan of Care (Signed)
  Problem: Education: Goal: Knowledge of secondary prevention will improve (MUST DOCUMENT ALL) Outcome: Progressing Goal: Knowledge of patient specific risk factors will improve (DELETE if not current risk factor) Outcome: Progressing   Problem: Ischemic Stroke/TIA Tissue Perfusion: Goal: Complications of ischemic stroke/TIA will be minimized Outcome: Progressing

## 2024-09-19 NOTE — Progress Notes (Signed)
 PHARMACIST - PHYSICIAN COMMUNICATION  CONCERNING:  Enoxaparin (Lovenox) for DVT Prophylaxis   ASSESSMENT: Patient has enoxaparin 30 mg subcutaneously every 24 hours ordered for VTE prophylaxis.   Body mass index is 26.33 kg/m.  Estimated Creatinine Clearance: 32.7 mL/min (by C-G formula based on SCr of 0.67 mg/dL).  Based on [facility] policy, patient qualifies for enoxaparin dosing of 40 mg every 24 hours because their creatinine clearance is >30 mL/min.  PLAN: Pharmacy has adjusted enoxaparin dose per [facility] policy.  Description: Patient is now receiving enoxaparin 40 mg subcutaneously every 24 hours.  Will M. Lenon, PharmD, BCPS Clinical Pharmacist 09/19/2024 8:30 AM

## 2024-09-19 NOTE — Discharge Summary (Signed)
 Physician Discharge Summary  Erin Browning DOB: [birth year] DOA: 09/13/2024  PCP: Cleotilde Oneil FALCON, MD  Admit date: 09/13/2024 Discharge date: 09/19/2024  Admitted From: home  Disposition:  SNF  Recommendations for Outpatient Follow-up:  Follow up with PCP in 1-2 weeks   Home Health: no  Equipment/Devices:  Discharge Condition: stable  CODE STATUS: DNR Diet recommendation: Regular   Brief/Interim Summary: HPI was taken from Dr. Sherre: [name] is a 89 year old female with history of hypertension, presents ED for chief concerns of left lower extremity weakness and a fall.   Vitals in the ED showed t 98, rr 16, hr 85, blood pressure 139/71, SpO2 100% on room air.   Serum sodium is 139, potassium 4.2, chloride 103, bicarb 26, BUN of 17, serum creatinine 0.87, eGFR 60, nonfasting blood glucose 133, WBC 7.1, hemoglobin 15.2, platelets 101.   CT head wo contrast: Was read as no acute intracranial abnormality.  Generalized cerebral atrophy and microvascular disease.  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   ED treatment: Sodium chloride  500 mg bolus. --------------------------------- At bedside, patient was able to confirm her first and last name, age, current location of hospital and verified her daughter and granddaughter at bedside.   She reports that she was reaching for her phone to put it on another side while she was on her walker and then she fell.  She was not able to get up.  This happened yesterday.  She denies loss of consciousness.  She reports she pressed the button to call for help and her daughter came.  Daughter found her laying on the floor and not able to get up.  She reports weakness on her left leg since that time.   She endorses some pain in the left lower extremity.  She reports this is never happened before.  She reports her left leg just gave out on her.  She denies difficulty speaking.  Discharge Diagnoses:   Principal Problem:   Stroke-like symptom Active Problems:   Complete heart block (HCC)   Pacemaker   Essential hypertension   Thrombocytopenia   Weakness of left lower extremity   HOH (hard of hearing)   Generalized weakness  Acute b/l LE weakness: persistent. CT head x 2 neg for any acute intracranial abnormalities. Able to move b/l LE slightly better today as per pt. PT/OT recs SNF. Tylenol  prn for pain.    HTN: BP is still WNL currently. Restart home dose of triameterene-HCTZ   Thrombocytopenia: resolved   Constipation: resolved.   Complete heart block: s/p pacemaker placement 12 years ago. No acute issues  Discharge Instructions  Discharge Instructions     Diet general   Complete by: As directed    Discharge instructions   Complete by: As directed    F/u w/ PCP in 1-2 weeks   Increase activity slowly   Complete by: As directed       Allergies as of 09/19/2024   No Known Allergies      Medication List     STOP taking these medications    MAGNESIUM  OXIDE 400 PO   mupirocin  ointment 2 % Commonly known as: BACTROBAN        TAKE these medications    cyanocobalamin  1000 MCG/ML injection Commonly known as: VITAMIN B12 Inject 1,000 mcg into the muscle every 30 (thirty) days.   furosemide 20 MG tablet Commonly known as: LASIX Take 20 mg by mouth daily as needed.   meloxicam 7.5 MG tablet  Commonly known as: MOBIC Take 7.5 mg by mouth daily.   triamterene -hydrochlorothiazide  37.5-25 MG tablet Commonly known as: MAXZIDE -25 Take 1 tablet by mouth 3 (three) times a week. Pt takes 3x a week        Contact information for follow-up providers     Cleotilde Oneil FALCON, MD Follow up.   Specialty: Internal Medicine Why: hospital follow up Contact information: [address] [facility] [address] [facility] [place] [address] [phone]              Contact information for after-discharge care     Destination     [facility] .   Service: Skilled Nursing Contact information: [address] [place]  [address] [phone]                    No Known Allergies  Consultations:   Procedures/Studies: CT HEAD WO CONTRAST ( ) Result Date: 09/14/2024 CLINICAL DATA:  89 year old female with fall, left side weakness. EXAM: CT HEAD WITHOUT CONTRAST TECHNIQUE: Contiguous axial images were obtained from the base of the skull through the vertex without intravenous contrast. RADIATION DOSE REDUCTION: This exam was performed according to the departmental dose-optimization program which includes automated exposure control, adjustment of the mA and/or kV according to patient size and/or use of iterative reconstruction technique. COMPARISON:  Head CT yesterday. FINDINGS: Brain: Cerebral volume is within normal limits for age. No midline shift, ventriculomegaly, mass effect, evidence of mass lesion, intracranial hemorrhage or evidence of cortically based acute infarction. Small but circumscribed hypodensity at the anterior left external capsule, bordering the anterior basal ganglia appears to be chronic small vessel disease and stable (series 2, image 19, see series 4 image 26). Elsewhere gray-white differentiation is normal for age. Vascular: Calcified atherosclerosis at the skull base. No suspicious intracranial vascular hyperdensity. Skull: Appears intact. Osteopenia. No acute osseous abnormality identified. Sinuses/Orbits: Visualized paranasal sinuses and mastoids are stable and well aerated. Other: No acute orbit or scalp soft tissue injury identified. IMPRESSION: 1. No acute intracranial abnormality or acute traumatic injury identified. 2. Mild for age chronic appearing small vessel disease, left basal ganglia/external capsule. Electronically Signed   By: VEAR Hurst M.D.   On: 09/14/2024 07:44   CT Head Wo Contrast Result Date: 09/13/2024 CLINICAL DATA:  Status post fall. EXAM: CT  HEAD WITHOUT CONTRAST TECHNIQUE: Contiguous axial images were obtained from the base of the skull through the vertex without intravenous contrast. RADIATION DOSE REDUCTION: This exam was performed according to the departmental dose-optimization program which includes automated exposure control, adjustment of the mA and/or kV according to patient size and/or use of iterative reconstruction technique. COMPARISON:  None Available. FINDINGS: Brain: There is generalized cerebral atrophy with widening of the extra-axial spaces and ventricular dilatation. There are areas of decreased attenuation within the white matter tracts of the supratentorial brain, consistent with microvascular disease changes. A small chronic left anterior external capsule white matter infarct is seen. Vascular: No hyperdense vessel or unexpected calcification. Skull: Normal. Negative for fracture or focal lesion. Sinuses/Orbits: No acute finding. Other: None. IMPRESSION: 1. Generalized cerebral atrophy and microvascular disease changes of the supratentorial brain. 2. 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 3. No acute intracranial abnormality. Electronically Signed   By: Suzen Dials M.D.   On: 09/13/2024 17:14   DG Tibia/Fibula Left Result Date: 09/13/2024 CLINICAL DATA:  Left leg pain after fall yesterday. EXAM: LEFT TIBIA AND FIBULA - 2 VIEW COMPARISON:  None Available. FINDINGS: There is no evidence of fracture or other focal bone lesions. Soft tissues are unremarkable. IMPRESSION: Negative. Electronically Signed   By: Lynwood Landy Raddle M.D.   On: 09/13/2024 16:38   DG Femur Min 2 Views Left Result Date: 09/13/2024 CLINICAL DATA:  Left leg pain after fall yesterday. EXAM: LEFT FEMUR 2 VIEWS COMPARISON:  None Available. FINDINGS: There is no evidence of fracture or other focal bone lesions. Soft tissues are unremarkable. IMPRESSION: Negative. Electronically Signed   By: Lynwood Landy Raddle M.D.   On: 09/13/2024  16:37   DG Pelvis 1-2 Views Result Date: 09/13/2024 CLINICAL DATA:  Left leg pain after fall yesterday. EXAM: PELVIS - 1-2 VIEW COMPARISON:  None Available. FINDINGS: There is no evidence of pelvic fracture or diastasis. No pelvic bone lesions are seen. IMPRESSION: No acute abnormality seen. Electronically Signed   By: Lynwood Landy Raddle M.D.   On: 09/13/2024 16:36   CUP PACEART REMOTE DEVICE CHECK Result Date: 08/24/2024 PPM Scheduled remote reviewed. Normal device function.  Presenting rhythm:  AS/VP 23 AMS EGM's, 2-8sec in duration, EGM's c/w PAT Next remote 91 days. LA, CVRS  (Echo, Carotid, EGD, Colonoscopy, ERCP)    Subjective: Pt c/o malaise   Discharge Exam: Vitals:   09/19/24 0344 09/19/24 0802  BP: 130/69 128/66  Pulse: (!) 101 95  Resp:  17  Temp: 98 F (36.7 C) 97.9 F (36.6 C)  SpO2: 97% 93%   Vitals:   09/18/24 1805 09/18/24 1928 09/19/24 0344 09/19/24 0802  BP: 132/71 130/66 130/69 128/66  Pulse: 96 93 (!) 101 95  Resp: 18   17  Temp: 97.7 F (36.5 C) 97.8 F (36.6 C) 98 F (36.7 C) 97.9 F (36.6 C)  TempSrc:  Oral Oral   SpO2: 98% 95% 97% 93%  Weight:      Height:        General: Pt is alert, awake, not in acute distress Cardiovascular:  S1/S2 +, no rubs, no gallops Respiratory: CTA bilaterally, no wheezing, no rhonchi Abdominal: Soft, NT, ND, bowel sounds + Extremities:  no cyanosis    The results of significant diagnostics from this hospitalization (including imaging, microbiology, ancillary and laboratory) are listed below for reference.     Microbiology: No results found for this or any previous visit (from the past 240 hours).   Labs: BNP (last 3 results) No results for input(s): BNP in the last 8760 hours. Basic Metabolic Panel: Recent Labs  Lab 09/15/24 0313 09/16/24 0420 09/17/24 0529 09/18/24 0339 09/19/24 0550  NA 138 133* 134* 136 136  K 3.7 3.9 3.8 3.8 3.9  CL 102 100 98 102 101  CO2 25 26 25 26 28   GLUCOSE 123* 98 103*  106* 128*  BUN 21 21 19 20 15   CREATININE 1.00 0.91 0.88 1.02* 0.67  CALCIUM 8.6* 8.2* 8.4* 8.4* 8.5*   Liver Function Tests: Recent Labs  Lab 09/13/24 1253  AST 22  ALT 14  ALKPHOS 101  BILITOT 1.1  PROT 7.1  ALBUMIN 3.7   No results for input(s): LIPASE, AMYLASE in the last 168 hours. No results for input(s): AMMONIA in the last 168 hours. CBC: Recent Labs  Lab 09/15/24 0313 09/16/24 0420 09/17/24 0529 09/18/24 0339 09/19/24 0550  WBC 8.2 7.3 6.4 6.4 6.2  HGB 13.9 13.1 13.2 12.8 12.7  HCT 42.9 40.3 40.6 40.1 39.7  MCV 84.3 84.0 83.2 84.6 84.5  PLT 94* 98* 133* 156 158   Cardiac Enzymes: No  results for input(s): CKTOTAL, CKMB, CKMBINDEX, TROPONINI in the last 168 hours. BNP: Invalid input(s): POCBNP CBG: Recent Labs  Lab 09/14/24 0748  GLUCAP 94   D-Dimer No results for input(s): DDIMER in the last 72 hours. Hgb A1c No results for input(s): HGBA1C in the last 72 hours. Lipid Profile No results for input(s): CHOL, HDL, LDLCALC, TRIG, CHOLHDL, LDLDIRECT in the last 72 hours. Thyroid  function studies No results for input(s): TSH, T4TOTAL, T3FREE, THYROIDAB in the last 72 hours.  Invalid input(s): FREET3 Anemia work up No results for input(s): VITAMINB12, FOLATE, FERRITIN, TIBC, IRON, RETICCTPCT in the last 72 hours. Urinalysis    Component Value Date/Time   COLORURINE YELLOW (A) 09/13/2024 1824   APPEARANCEUR HAZY (A) 09/13/2024 1824   LABSPEC 1.012 09/13/2024 1824   PHURINE 7.0 09/13/2024 1824   GLUCOSEU NEGATIVE 09/13/2024 1824   HGBUR NEGATIVE 09/13/2024 1824   BILIRUBINUR NEGATIVE 09/13/2024 1824   KETONESUR 5 (A) 09/13/2024 1824   PROTEINUR NEGATIVE 09/13/2024 1824   NITRITE NEGATIVE 09/13/2024 1824   LEUKOCYTESUR NEGATIVE 09/13/2024 1824   Sepsis Labs Recent Labs  Lab 09/16/24 0420 09/17/24 0529 09/18/24 0339 09/19/24 0550  WBC 7.3 6.4 6.4 6.2   Microbiology No results found for  this or any previous visit (from the past 240 hours).   Time coordinating discharge: 35 minutes  SIGNED:   Anthony CHRISTELLA Pouch, MD  [facility] 09/19/2024, 1:57 PM Pager   If 7PM-7AM, please contact night-coverage [email or web address]

## 2024-09-19 NOTE — Progress Notes (Signed)
 Physical Therapy Treatment Patient Details Name: Erin Browning MRN: [id number] DOB: 07/09/1923 Today's Date: 09/19/2024   History of Present Illness Pt is a 89 y.o. female presenting after unwitnessed fall at home with LLE weakness & pain. CT and X-rays negative. PMH significant for HTN, HOH, pacemaker.    PT Comments  Pt received in bed, VSS, c/o neck pain due to positioning overnight. Pt continues to require ModA for bed mobility and transfers sit<>stand at RW. B LE weakness in standing with slight trembling at knees, high fall risk requiring Min/ModA for safe short distance gait to bedside chair. Assist to safely lower to sitting with cues for hand placement. Pt able to self scoot buttocks back into chair with good upper body strength. Currently awaiting transition to STR later this afternoon. Continued PT services recommended.    If plan is discharge home, recommend the following: Two people to help with walking and/or transfers;Two people to help with bathing/dressing/bathroom;Assist for transportation;Help with stairs or ramp for entrance   Can travel by private vehicle     No  Equipment Recommendations  None recommended by PT    Recommendations for Other Services       Precautions / Restrictions Precautions Precautions: Fall;ICD/Pacemaker Recall of Precautions/Restrictions: Intact Restrictions Weight Bearing Restrictions Per Provider Order: No     Mobility  Bed Mobility Overal bed mobility: Needs Assistance Bed Mobility: Sit to Supine       Sit to supine: Mod assist, HOB elevated, Used rails   General bed mobility comments:  (ModA to scoot to EOB, no dizziness noted)    Transfers Overall transfer level: Needs assistance Equipment used: Rolling walker (2 wheels) Transfers: Sit to/from Stand Sit to Stand: Mod assist           General transfer comment:  (ModA to raise from bed with cues for hand placement and initial balance)     Ambulation/Gait Ambulation/Gait assistance: Min assist, Mod assist Gait Distance (Feet): 4 Feet Assistive device: Rolling walker (2 wheels) Gait Pattern/deviations: Step-to pattern, Decreased stride length, Decreased dorsiflexion - right, Decreased weight shift to left, Decreased dorsiflexion - left Gait velocity: decr     General Gait Details:  (Very unsteady, B LE's shakey, high fall risk)   Stairs             Wheelchair Mobility     Tilt Bed    Modified Rankin (Stroke Patients Only)       Balance Overall balance assessment: Needs assistance Sitting-balance support: Feet supported, Bilateral upper extremity supported Sitting balance-Leahy Scale: Good     Standing balance support: Bilateral upper extremity supported, Reliant on assistive device for balance, During functional activity Standing balance-Leahy Scale: Poor Standing balance comment:  (High fall risk, B LE's weak, shakey in standing)                            Communication Communication Communication: Impaired Factors Affecting Communication: Hearing impaired  Cognition Arousal: Alert Behavior During Therapy: WFL for tasks assessed/performed   PT - Cognitive impairments: No apparent impairments                         Following commands: Intact      Cueing Cueing Techniques: Verbal cues, Tactile cues, Visual cues  Exercises      General Comments General comments (skin integrity, edema, etc.):  (Dscussed current plan for STR at d/c with pt and family. All questions  and concerns addressed)      Pertinent Vitals/Pain Pain Assessment Pain Assessment: Faces Faces Pain Scale: Hurts a little bit Pain Location: Neck Pain Descriptors / Indicators: Aching, Discomfort, Grimacing Pain Intervention(s): Relaxation, Heat applied    Home Living                          Prior Function            PT Goals (current goals can now be found in the care plan section)  Acute Rehab PT Goals Patient Stated Goal: Pt wants to get stronger. pt agreeable to SNF placement    Frequency    Min 2X/week      PT Plan      Co-evaluation              AM-PAC PT 6 Clicks Mobility   Outcome Measure  Help needed turning from your back to your side while in a flat bed without using bedrails?: A Lot Help needed moving from lying on your back to sitting on the side of a flat bed without using bedrails?: A Lot Help needed moving to and from a bed to a chair (including a wheelchair)?: Total Help needed standing up from a chair using your arms (e.g., wheelchair or bedside chair)?: A Lot Help needed to walk in hospital room?: Total Help needed climbing 3-5 steps with a railing? : Total 6 Click Score: 9    End of Session Equipment Utilized During Treatment: Gait belt Activity Tolerance: Patient tolerated treatment well Patient left: in chair;with call bell/phone within reach;with chair alarm set;with family/visitor present Nurse Communication: Mobility status PT Visit Diagnosis: Unsteadiness on feet (R26.81);Other abnormalities of gait and mobility (R26.89);Difficulty in walking, not elsewhere classified (R26.2);History of falling (Z91.81);Muscle weakness (generalized) (M62.81);Pain Pain - part of body:  (Neck)     Time: 1131-1150 PT Time Calculation (min) (ACUTE ONLY): 19 min  Charges:    $Therapeutic Activity: 8-22 mins PT General Charges $$ ACUTE PT VISIT: 1 Visit                    Darice Bohr, PTA  Darice JAYSON Bohr 09/19/2024, 12:09 PM

## 2024-09-19 NOTE — TOC Transition Note (Signed)
 [facility] - Discharge Note   Patient Details  Name: Erin Browning MRN: [id number] Date of Birth: Jun 17, 1923  [facility] CM/SW Contact:  [name], RN Phone Number: 09/19/2024, 2:27 PM   Clinical Narrative:     HTA is valid for 7 days the 1st bed day starts the day the patient admits to the SNF for STR.  Patient is medically clear to discharge to [facility] for STR. [facility] to transport.   Final next level of care: Skilled Nursing Facility Barriers to Discharge: Barriers Resolved   Patient Goals and CMS Choice     Choice offered to / list presented to : Patient, Adult Children      Discharge Placement              Patient chooses bed at: [facility] Additional resources added to the After Visit Summary for       Post Acute Care Choice: Skilled Nursing Facility                               Social Drivers of Health (SDOH) Interventions SDOH Screenings   Food Insecurity: No Food Insecurity (09/14/2024)  Housing: Low Risk  (09/14/2024)  Transportation Needs: No Transportation Needs (09/13/2024)  Utilities: Not At Risk (09/13/2024)  Financial Resource Strain: Low Risk  (06/01/2024)   Received from [facility]  Social Connections: Moderately Isolated (09/13/2024)  Tobacco Use: Medium Risk (09/13/2024)     Readmission Risk Interventions     No data to display

## 2024-09-19 NOTE — Progress Notes (Signed)
 Report called to Electrical engineer at [facility].

## 2024-09-21 DIAGNOSIS — I10 Essential (primary) hypertension: Secondary | ICD-10-CM | POA: Diagnosis not present

## 2024-09-21 DIAGNOSIS — Z950 Presence of cardiac pacemaker: Secondary | ICD-10-CM | POA: Diagnosis not present

## 2024-09-21 DIAGNOSIS — M6281 Muscle weakness (generalized): Secondary | ICD-10-CM | POA: Diagnosis not present

## 2024-09-21 DIAGNOSIS — D519 Vitamin B12 deficiency anemia, unspecified: Secondary | ICD-10-CM | POA: Diagnosis not present

## 2024-09-21 DIAGNOSIS — I455 Other specified heart block: Secondary | ICD-10-CM | POA: Diagnosis not present

## 2024-09-23 DIAGNOSIS — D519 Vitamin B12 deficiency anemia, unspecified: Secondary | ICD-10-CM | POA: Diagnosis not present

## 2024-09-23 DIAGNOSIS — I455 Other specified heart block: Secondary | ICD-10-CM | POA: Diagnosis not present

## 2024-09-23 DIAGNOSIS — Z950 Presence of cardiac pacemaker: Secondary | ICD-10-CM | POA: Diagnosis not present

## 2024-09-23 DIAGNOSIS — I10 Essential (primary) hypertension: Secondary | ICD-10-CM | POA: Diagnosis not present

## 2024-09-23 DIAGNOSIS — M6281 Muscle weakness (generalized): Secondary | ICD-10-CM | POA: Diagnosis not present

## 2024-09-27 DIAGNOSIS — Z950 Presence of cardiac pacemaker: Secondary | ICD-10-CM | POA: Diagnosis not present

## 2024-09-27 DIAGNOSIS — M6281 Muscle weakness (generalized): Secondary | ICD-10-CM | POA: Diagnosis not present

## 2024-09-27 DIAGNOSIS — D519 Vitamin B12 deficiency anemia, unspecified: Secondary | ICD-10-CM | POA: Diagnosis not present

## 2024-09-27 DIAGNOSIS — I10 Essential (primary) hypertension: Secondary | ICD-10-CM | POA: Diagnosis not present

## 2024-09-27 DIAGNOSIS — I455 Other specified heart block: Secondary | ICD-10-CM | POA: Diagnosis not present

## 2024-09-29 DIAGNOSIS — M1990 Unspecified osteoarthritis, unspecified site: Secondary | ICD-10-CM | POA: Diagnosis not present

## 2024-09-29 DIAGNOSIS — M810 Age-related osteoporosis without current pathological fracture: Secondary | ICD-10-CM | POA: Diagnosis not present

## 2024-09-29 DIAGNOSIS — R29898 Other symptoms and signs involving the musculoskeletal system: Secondary | ICD-10-CM | POA: Diagnosis not present

## 2024-09-29 DIAGNOSIS — D693 Immune thrombocytopenic purpura: Secondary | ICD-10-CM | POA: Diagnosis not present

## 2024-09-29 DIAGNOSIS — I110 Hypertensive heart disease with heart failure: Secondary | ICD-10-CM | POA: Diagnosis not present

## 2024-09-29 DIAGNOSIS — I509 Heart failure, unspecified: Secondary | ICD-10-CM | POA: Diagnosis not present

## 2024-09-29 DIAGNOSIS — I442 Atrioventricular block, complete: Secondary | ICD-10-CM | POA: Diagnosis not present

## 2024-09-29 DIAGNOSIS — I6930 Unspecified sequelae of cerebral infarction: Secondary | ICD-10-CM | POA: Diagnosis not present

## 2024-09-29 DIAGNOSIS — G319 Degenerative disease of nervous system, unspecified: Secondary | ICD-10-CM | POA: Diagnosis not present

## 2024-09-29 DIAGNOSIS — Z950 Presence of cardiac pacemaker: Secondary | ICD-10-CM | POA: Diagnosis not present

## 2024-09-29 DIAGNOSIS — D519 Vitamin B12 deficiency anemia, unspecified: Secondary | ICD-10-CM | POA: Diagnosis not present

## 2024-10-05 DIAGNOSIS — G319 Degenerative disease of nervous system, unspecified: Secondary | ICD-10-CM | POA: Diagnosis not present

## 2024-10-05 DIAGNOSIS — I442 Atrioventricular block, complete: Secondary | ICD-10-CM | POA: Diagnosis not present

## 2024-10-05 DIAGNOSIS — I110 Hypertensive heart disease with heart failure: Secondary | ICD-10-CM | POA: Diagnosis not present

## 2024-10-05 DIAGNOSIS — I509 Heart failure, unspecified: Secondary | ICD-10-CM | POA: Diagnosis not present

## 2024-10-05 DIAGNOSIS — D693 Immune thrombocytopenic purpura: Secondary | ICD-10-CM | POA: Diagnosis not present

## 2024-10-11 DIAGNOSIS — D696 Thrombocytopenia, unspecified: Secondary | ICD-10-CM | POA: Diagnosis not present

## 2024-10-11 DIAGNOSIS — H9190 Unspecified hearing loss, unspecified ear: Secondary | ICD-10-CM | POA: Diagnosis not present

## 2024-10-11 DIAGNOSIS — Z9181 History of falling: Secondary | ICD-10-CM | POA: Diagnosis not present

## 2024-10-11 DIAGNOSIS — I10 Essential (primary) hypertension: Secondary | ICD-10-CM | POA: Diagnosis not present

## 2024-10-11 DIAGNOSIS — M1990 Unspecified osteoarthritis, unspecified site: Secondary | ICD-10-CM | POA: Diagnosis not present

## 2024-10-11 DIAGNOSIS — I442 Atrioventricular block, complete: Secondary | ICD-10-CM | POA: Diagnosis not present

## 2024-10-11 DIAGNOSIS — Z950 Presence of cardiac pacemaker: Secondary | ICD-10-CM | POA: Diagnosis not present

## 2024-10-11 DIAGNOSIS — D519 Vitamin B12 deficiency anemia, unspecified: Secondary | ICD-10-CM | POA: Diagnosis not present

## 2024-10-11 DIAGNOSIS — Z974 Presence of external hearing-aid: Secondary | ICD-10-CM | POA: Diagnosis not present

## 2024-10-24 DIAGNOSIS — R29898 Other symptoms and signs involving the musculoskeletal system: Secondary | ICD-10-CM | POA: Diagnosis not present

## 2024-10-24 DIAGNOSIS — G2581 Restless legs syndrome: Secondary | ICD-10-CM | POA: Diagnosis not present

## 2024-10-24 DIAGNOSIS — M4807 Spinal stenosis, lumbosacral region: Secondary | ICD-10-CM | POA: Diagnosis not present

## 2025-01-02 ENCOUNTER — Ambulatory Visit
Admission: RE | Admit: 2025-01-02 | Discharge: 2025-01-02 | Disposition: A | Discharge status: Home / Self Care | Source: Ambulatory Visit | Attending: Internal Medicine | Admitting: Internal Medicine

## 2025-01-02 ENCOUNTER — Other Ambulatory Visit: Payer: Self-pay | Admitting: Internal Medicine

## 2025-01-02 DIAGNOSIS — R600 Localized edema: Secondary | ICD-10-CM

## 2025-01-02 DIAGNOSIS — Z86718 Personal history of other venous thrombosis and embolism: Secondary | ICD-10-CM | POA: Diagnosis present

## 2025-02-21 ENCOUNTER — Ambulatory Visit

## 2025-05-23 ENCOUNTER — Ambulatory Visit

## 2025-08-22 ENCOUNTER — Ambulatory Visit

## 2025-11-21 ENCOUNTER — Ambulatory Visit

## 2026-02-20 ENCOUNTER — Ambulatory Visit
